# Patient Record
Sex: Male | Born: 1955 | Race: Black or African American | Hispanic: No | Marital: Single | Smoking: Current every day smoker
Health system: Southern US, Community
[De-identification: ages and names within clinical notes are randomized; demographics above are authoritative.]

## PROBLEM LIST (undated history)

## (undated) DIAGNOSIS — J189 Pneumonia, unspecified organism: Secondary | ICD-10-CM

## (undated) DIAGNOSIS — K635 Polyp of colon: Secondary | ICD-10-CM

## (undated) DIAGNOSIS — E785 Hyperlipidemia, unspecified: Secondary | ICD-10-CM

## (undated) DIAGNOSIS — Z21 Asymptomatic human immunodeficiency virus [HIV] infection status: Secondary | ICD-10-CM

## (undated) DIAGNOSIS — R7989 Other specified abnormal findings of blood chemistry: Secondary | ICD-10-CM

## (undated) DIAGNOSIS — I82409 Acute embolism and thrombosis of unspecified deep veins of unspecified lower extremity: Secondary | ICD-10-CM

## (undated) DIAGNOSIS — N200 Calculus of kidney: Secondary | ICD-10-CM

## (undated) DIAGNOSIS — B20 Human immunodeficiency virus [HIV] disease: Secondary | ICD-10-CM

## (undated) DIAGNOSIS — N529 Male erectile dysfunction, unspecified: Secondary | ICD-10-CM

## (undated) DIAGNOSIS — G510 Bell's palsy: Secondary | ICD-10-CM

## (undated) HISTORY — PX: LITHOTRIPSY: SUR834

## (undated) HISTORY — DX: Other specified abnormal findings of blood chemistry: R79.89

## (undated) HISTORY — DX: Polyp of colon: K63.5

## (undated) HISTORY — PX: COLONOSCOPY: SHX174

## (undated) HISTORY — DX: Calculus of kidney: N20.0

## (undated) HISTORY — DX: Hyperlipidemia, unspecified: E78.5

## (undated) HISTORY — DX: Human immunodeficiency virus (HIV) disease: B20

## (undated) HISTORY — DX: Male erectile dysfunction, unspecified: N52.9

## (undated) HISTORY — PX: INGUINAL HERNIA REPAIR: SUR1180

## (undated) HISTORY — DX: Asymptomatic human immunodeficiency virus (hiv) infection status: Z21

## (undated) HISTORY — DX: Pneumonia, unspecified organism: J18.9

## (undated) HISTORY — DX: Acute embolism and thrombosis of unspecified deep veins of unspecified lower extremity: I82.409

## (undated) HISTORY — DX: Bell's palsy: G51.0

---

## 1990-07-28 DIAGNOSIS — I82409 Acute embolism and thrombosis of unspecified deep veins of unspecified lower extremity: Secondary | ICD-10-CM

## 1990-07-28 HISTORY — DX: Acute embolism and thrombosis of unspecified deep veins of unspecified lower extremity: I82.409

## 2013-07-28 DIAGNOSIS — G510 Bell's palsy: Secondary | ICD-10-CM

## 2013-07-28 HISTORY — DX: Bell's palsy: G51.0

## 2016-10-28 DIAGNOSIS — B20 Human immunodeficiency virus [HIV] disease: Secondary | ICD-10-CM | POA: Diagnosis not present

## 2016-10-28 DIAGNOSIS — E559 Vitamin D deficiency, unspecified: Secondary | ICD-10-CM | POA: Diagnosis not present

## 2017-03-03 DIAGNOSIS — B20 Human immunodeficiency virus [HIV] disease: Secondary | ICD-10-CM | POA: Diagnosis not present

## 2017-03-03 DIAGNOSIS — E559 Vitamin D deficiency, unspecified: Secondary | ICD-10-CM | POA: Diagnosis not present

## 2017-04-09 ENCOUNTER — Other Ambulatory Visit: Payer: Federal, State, Local not specified - PPO

## 2017-04-09 ENCOUNTER — Other Ambulatory Visit (HOSPITAL_COMMUNITY)
Admission: RE | Admit: 2017-04-09 | Discharge: 2017-04-09 | Disposition: A | Discharge status: Home / Self Care | Payer: Federal, State, Local not specified - PPO | Source: Ambulatory Visit | Attending: Infectious Diseases | Admitting: Infectious Diseases

## 2017-04-09 DIAGNOSIS — Z111 Encounter for screening for respiratory tuberculosis: Secondary | ICD-10-CM

## 2017-04-09 DIAGNOSIS — Z79899 Other long term (current) drug therapy: Secondary | ICD-10-CM | POA: Diagnosis not present

## 2017-04-09 DIAGNOSIS — B20 Human immunodeficiency virus [HIV] disease: Secondary | ICD-10-CM

## 2017-04-09 DIAGNOSIS — Z113 Encounter for screening for infections with a predominantly sexual mode of transmission: Secondary | ICD-10-CM | POA: Insufficient documentation

## 2017-04-10 LAB — URINE CYTOLOGY ANCILLARY ONLY
Chlamydia: NEGATIVE
Neisseria Gonorrhea: NEGATIVE

## 2017-04-10 LAB — T-HELPER CELL (CD4) - (RCID CLINIC ONLY)
CD4 T CELL ABS: 1110 /uL (ref 400–2700)
CD4 T CELL HELPER: 24 % — AB (ref 33–55)

## 2017-04-11 LAB — HIV-1 RNA ULTRAQUANT REFLEX TO GENTYP+
HIV 1 RNA Quant: 64 Copies/mL — ABNORMAL HIGH
HIV-1 RNA QUANT, LOG: 1.8 {Log_copies}/mL — AB

## 2017-04-16 LAB — CBC WITH DIFFERENTIAL/PLATELET
BASOS PCT: 0.3 %
Basophils Absolute: 20 cells/uL (ref 0–200)
EOS ABS: 102 {cells}/uL (ref 15–500)
Eosinophils Relative: 1.5 %
HEMATOCRIT: 47.2 % (ref 38.5–50.0)
HEMOGLOBIN: 16.2 g/dL (ref 13.2–17.1)
Lymphs Abs: 4597 cells/uL — ABNORMAL HIGH (ref 850–3900)
MCH: 32.7 pg (ref 27.0–33.0)
MCHC: 34.3 g/dL (ref 32.0–36.0)
MCV: 95.2 fL (ref 80.0–100.0)
MPV: 10.2 fL (ref 7.5–12.5)
Monocytes Relative: 5.4 %
NEUTROS ABS: 1714 {cells}/uL (ref 1500–7800)
Neutrophils Relative %: 25.2 %
Platelets: 198 10*3/uL (ref 140–400)
RBC: 4.96 10*6/uL (ref 4.20–5.80)
RDW: 13.2 % (ref 11.0–15.0)
Total Lymphocyte: 67.6 %
WBC: 6.8 10*3/uL (ref 3.8–10.8)
WBCMIX: 367 {cells}/uL (ref 200–950)

## 2017-04-16 LAB — LIPID PANEL
CHOLESTEROL: 223 mg/dL — AB (ref <200)
HDL: 58 mg/dL (ref >40)
LDL Cholesterol (Calc): 139 mg/dL (calc) — ABNORMAL HIGH
Non-HDL Cholesterol (Calc): 165 mg/dL (calc) — ABNORMAL HIGH (ref <130)
Total CHOL/HDL Ratio: 3.8 (calc) (ref <5.0)
Triglycerides: 136 mg/dL (ref <150)

## 2017-04-16 LAB — COMPLETE METABOLIC PANEL WITH GFR
AG RATIO: 2 (calc) (ref 1.0–2.5)
ALBUMIN MSPROF: 4.5 g/dL (ref 3.6–5.1)
ALKALINE PHOSPHATASE (APISO): 82 U/L (ref 40–115)
ALT: 17 U/L (ref 9–46)
AST: 28 U/L (ref 10–35)
BILIRUBIN TOTAL: 0.5 mg/dL (ref 0.2–1.2)
BUN / CREAT RATIO: 10 (calc) (ref 6–22)
BUN: 14 mg/dL (ref 7–25)
CHLORIDE: 103 mmol/L (ref 98–110)
CO2: 28 mmol/L (ref 20–32)
Calcium: 9.5 mg/dL (ref 8.6–10.3)
Creat: 1.41 mg/dL — ABNORMAL HIGH (ref 0.70–1.33)
GFR, Est African American: 63 mL/min/{1.73_m2} (ref >=60)
GFR, Est Non African American: 55 mL/min/{1.73_m2} — ABNORMAL LOW (ref >=60)
GLOBULIN: 2.3 g/dL (ref 1.9–3.7)
Glucose, Bld: 93 mg/dL (ref 65–99)
Potassium: 4.5 mmol/L (ref 3.5–5.3)
SODIUM: 140 mmol/L (ref 135–146)
TOTAL PROTEIN: 6.8 g/dL (ref 6.1–8.1)

## 2017-04-16 LAB — HEPATITIS C ANTIBODY
HEP C AB: NONREACTIVE
SIGNAL TO CUT-OFF: 0.03 (ref <1.00)

## 2017-04-16 LAB — QUANTIFERON TB GOLD ASSAY (BLOOD)
QUANTIFERON NIL VALUE: 0.04 [IU]/mL
QUANTIFERON TB AG MINUS NIL: 0.03 [IU]/mL
QUANTIFERON(R)-TB GOLD: NEGATIVE

## 2017-04-16 LAB — HEPATITIS A ANTIBODY, TOTAL: Hepatitis A AB,Total: REACTIVE — AB

## 2017-04-16 LAB — HLA B*5701: HLA-B*5701 w/rflx HLA-B High: POSITIVE — AB

## 2017-04-16 LAB — HEPATITIS B CORE ANTIBODY, TOTAL: HEP B C TOTAL AB: NONREACTIVE

## 2017-04-16 LAB — HEPATITIS B SURFACE ANTIGEN: HEP B S AG: NONREACTIVE

## 2017-04-16 LAB — HEPATITIS B SURFACE ANTIBODY,QUALITATIVE: HEP B S AB: REACTIVE — AB

## 2017-04-16 LAB — RPR: RPR: NONREACTIVE

## 2017-04-23 ENCOUNTER — Ambulatory Visit (INDEPENDENT_AMBULATORY_CARE_PROVIDER_SITE_OTHER): Payer: Federal, State, Local not specified - PPO | Admitting: Infectious Diseases

## 2017-04-23 ENCOUNTER — Encounter: Payer: Self-pay | Admitting: Infectious Diseases

## 2017-04-23 VITALS — BP 121/85 | HR 78 | Temp 99.0°F | Wt 179.0 lb

## 2017-04-23 DIAGNOSIS — IMO0002 Reserved for concepts with insufficient information to code with codable children: Secondary | ICD-10-CM | POA: Insufficient documentation

## 2017-04-23 DIAGNOSIS — R7989 Other specified abnormal findings of blood chemistry: Secondary | ICD-10-CM

## 2017-04-23 DIAGNOSIS — Z720 Tobacco use: Secondary | ICD-10-CM | POA: Diagnosis not present

## 2017-04-23 DIAGNOSIS — B20 Human immunodeficiency virus [HIV] disease: Secondary | ICD-10-CM | POA: Diagnosis not present

## 2017-04-23 NOTE — Patient Instructions (Signed)
Nice to meet you!  Please continue taking your Prezcobix and Tivicay   Will have you come back in 1 month for labs and a visit with me in 6 weeks.

## 2017-04-23 NOTE — Progress Notes (Signed)
PCP - Patient, No Pcp Per    Patient Active Problem List   Diagnosis Date Noted  . HIV (human immunodeficiency virus infection) ([place]) 04/23/2017  . Creatinine elevation 04/23/2017  . Tobacco abuse 04/23/2017    Patient's Medications  New Prescriptions   No medications on file  Previous Medications   KRILL OIL 1000 MG CAPS    Take by mouth.   MULTIPLE VITAMINS-MINERALS (CENTRUM SILVER 50+MEN PO)    Take by mouth.   PREZCOBIX 800-150 MG TABLET       SILDENAFIL (VIAGRA) 100 MG TABLET    Take 100 mg by mouth daily as needed for erectile dysfunction.   TIVICAY 50 MG TABLET      Modified Medications   No medications on file  Discontinued Medications   No medications on file    SUBJECTIVE: Lorin Gawron presents to clinic today to establish care for his HIV infection.  HPI:  HIV = [name] moved to [facility] Jan 2018 from [place] area. Retired since 2017. No history of OIs. He is currently on a regimen of TIVICAY + PREZCOBIX - Dx Feb 1995 and has been on several medications for his HIV infection. He recalls being told he has mutations per his prior HIV provider. Can articulate that he is often undetectable with a healthy CD4 count. Last was seen in August of this year at his clinic in [place], [place] (Dr. Ula Lingo); he is now retired and plans on staying locally here in [place] area. Endorses no complaints today suggestive of associated opportunistic infection or advancing HIV disease such as fevers, night sweats, weight loss, anorexia, cough, SOB, nausea, vomiting, diarrhea, headache, sensory changes, lymphadenopathy or oral thrush. He has not missed any doses of his medications. Currently not sexually active.   Elevated Creatinine - has a history of kidney stones. Has had lithotripsy in the past. Passed a stone in the last few weeks potentially around the time of this lab draw. Has not had any back/flank pain, chills or fevers recently. Has seen urology or nephrology in the past.  When he feels the dull pain in his back usually increases his water intake significantly to flush himself out better.   Health Maintenance = Willow Reczek reports occasional exercise and much improved stress since retirement. He is currently smoking about 1/2 - 1 ppd and has for many years. Does not have any other chronic comorbid conditions that requires treatment.   Review of Systems: Review of Systems  Constitutional: Negative for chills, fever, malaise/fatigue and weight loss.  HENT: Negative for sore throat.        No dental problems  Respiratory: Negative for cough and sputum production.   Cardiovascular: Negative for chest pain and leg swelling.  Gastrointestinal: Negative for abdominal pain, diarrhea and vomiting.  Genitourinary: Negative for dysuria and flank pain.  Musculoskeletal: Negative for joint pain, myalgias and neck pain.  Skin: Negative for rash.  Neurological: Negative for dizziness, tingling and headaches.  Psychiatric/Behavioral: Negative for depression and substance abuse. The patient is not nervous/anxious and does not have insomnia.     No past medical history on file.  Social History  Substance Use Topics  . Smoking status: Current Every Day Smoker    Packs/day: 0.50    Types: Cigarettes  . Smokeless tobacco: Never Used     Comment: trying to quit  . Alcohol use 1.2 oz/week    1 Glasses of wine, 1 Shots of liquor per week  Comment: occ     No family history on file.  Not on File  Objective:  Vitals:   04/23/17 0844  BP: 121/85  Pulse: 78  Temp: 99 F (37.2 C)  TempSrc: Oral  Weight: 179 lb (81.2 kg)   There is no height or weight on file to calculate BMI.  Physical Exam  Constitutional: He is oriented to person, place, and time and well-developed, well-nourished, and in no distress.  HENT:  Mouth/Throat: No oral lesions. Normal dentition. No dental caries.  Eyes: No scleral icterus.  Cardiovascular: Normal rate, regular rhythm and  normal heart sounds.   Pulmonary/Chest: Effort normal and breath sounds normal.  Abdominal: Soft. He exhibits no distension. There is no tenderness.  Lymphadenopathy:    He has no cervical adenopathy.  Neurological: He is alert and oriented to person, place, and time.  Skin: Skin is warm and dry. No rash noted.  Psychiatric: Mood and affect normal.    Lab Results Lab Results  Component Value Date   WBC 6.8 04/09/2017   HGB 16.2 04/09/2017   HCT 47.2 04/09/2017   MCV 95.2 04/09/2017   PLT 198 04/09/2017    Lab Results  Component Value Date   CREATININE 1.41 (H) 04/09/2017   BUN 14 04/09/2017   NA 140 04/09/2017   K 4.5 04/09/2017   CL 103 04/09/2017   CO2 28 04/09/2017    Lab Results  Component Value Date   ALT 17 04/09/2017   AST 28 04/09/2017   BILITOT 0.5 04/09/2017    Lab Results  Component Value Date   CHOL 223 (H) 04/09/2017   HDL 58 04/09/2017   TRIG 136 04/09/2017   CHOLHDL 3.8 04/09/2017   HIV 1 RNA Quant (Copies/mL)  Date Value  04/09/2017 64 (H)   CD4 T Cell Abs (/uL)  Date Value  04/09/2017 1,110   No results found for: HAV Lab Results  Component Value Date   HEPBSAG NON-REACTIVE 04/09/2017   HEPBSAB REACTIVE (A) 04/09/2017   No results found for: HCVAB Lab Results  Component Value Date   CHLAMYDIAWP Negative 04/09/2017   N Negative 04/09/2017   No results found for: GCPROBEAPT No results found for: QUANTGOLD No results found for: RPR    Problem List Items Addressed This Visit      Other   Creatinine elevation    Not previously elevated to this extent per review of limited records. Unable to provide urine today. Will have him collect UA and BMET for next office visit to continue monitoring. May need nephrology referral.       Relevant Orders   COMPLETE METABOLIC PANEL WITH GFR   Urinalysis   Basic metabolic panel   HIV (human immunodeficiency virus infection) ([place]) - Primary (Chronic)    Will continue current regimen of TIVICAY  + PREZCOBIX. VL was 62 - will have him come back in 2 months so we can reassess trajectory. Will seek to obtain resistance profile. May need to add/change therapy. He does not need refills today and has Pharmacist, community. I advised him to call the office to report pharmacy once he finds one locally (currently having meds mailed to him).       Relevant Medications   PREZCOBIX 800-150 MG tablet   TIVICAY 50 MG tablet   Other Relevant Orders   HIV 1 RNA quant-no reflex-bld   Tobacco abuse    Counseled to quit today to reduce overall CV risk. We discussed that he is of prime  age for men to start having significant problems with ACS/CV disease as his cholesterol is a bit elevated over target.          Janene Madeira, MSN, NP-C [facility] for Infectious [place] [facility] Pager: [phone]  04/25/17 5:36 PM

## 2017-04-25 NOTE — Assessment & Plan Note (Signed)
Counseled to quit today to reduce overall CV risk. We discussed that he is of prime age for men to start having significant problems with ACS/CV disease as his cholesterol is a bit elevated over target.

## 2017-04-25 NOTE — Assessment & Plan Note (Signed)
Not previously elevated to this extent per review of limited records. Unable to provide urine today. Will have him collect UA and BMET for next office visit to continue monitoring. May need nephrology referral.

## 2017-04-25 NOTE — Assessment & Plan Note (Addendum)
Will continue current regimen of TIVICAY + PREZCOBIX. VL was 62 - will have him come back in 2 months so we can reassess trajectory. Will seek to obtain resistance profile. May need to add/change therapy. He does not need refills today and has Pharmacist, community. I advised him to call the office to report pharmacy once he finds one locally (currently having meds mailed to him).

## 2017-05-25 ENCOUNTER — Other Ambulatory Visit: Payer: Federal, State, Local not specified - PPO

## 2017-05-25 DIAGNOSIS — R7989 Other specified abnormal findings of blood chemistry: Secondary | ICD-10-CM

## 2017-05-25 DIAGNOSIS — B20 Human immunodeficiency virus [HIV] disease: Secondary | ICD-10-CM

## 2017-05-25 LAB — COMPLETE METABOLIC PANEL WITH GFR
AG Ratio: 1.7 (calc) (ref 1.0–2.5)
ALKALINE PHOSPHATASE (APISO): 81 U/L (ref 40–115)
ALT: 28 U/L (ref 9–46)
AST: 36 U/L — ABNORMAL HIGH (ref 10–35)
Albumin: 4.3 g/dL (ref 3.6–5.1)
BUN: 16 mg/dL (ref 7–25)
CO2: 28 mmol/L (ref 20–32)
CREATININE: 1.31 mg/dL (ref 0.70–1.33)
Calcium: 9.7 mg/dL (ref 8.6–10.3)
Chloride: 104 mmol/L (ref 98–110)
GFR, Est African American: 69 mL/min/{1.73_m2} (ref >=60)
GFR, Est Non African American: 60 mL/min/{1.73_m2} (ref >=60)
GLOBULIN: 2.6 g/dL (ref 1.9–3.7)
GLUCOSE: 94 mg/dL (ref 65–99)
Potassium: 4.4 mmol/L (ref 3.5–5.3)
SODIUM: 138 mmol/L (ref 135–146)
Total Bilirubin: 0.4 mg/dL (ref 0.2–1.2)
Total Protein: 6.9 g/dL (ref 6.1–8.1)

## 2017-05-27 LAB — HIV-1 RNA QUANT-NO REFLEX-BLD
HIV 1 RNA Quant: <20 copies/mL
HIV-1 RNA Quant, Log: <1.3 Log copies/mL

## 2017-05-29 ENCOUNTER — Encounter: Payer: Self-pay | Admitting: Licensed Clinical Social Worker

## 2017-06-08 ENCOUNTER — Encounter: Payer: Self-pay | Admitting: Infectious Diseases

## 2017-06-08 ENCOUNTER — Ambulatory Visit: Payer: Federal, State, Local not specified - PPO | Admitting: Infectious Diseases

## 2017-06-08 DIAGNOSIS — Z0000 Encounter for general adult medical examination without abnormal findings: Secondary | ICD-10-CM | POA: Diagnosis not present

## 2017-06-08 DIAGNOSIS — Z720 Tobacco use: Secondary | ICD-10-CM | POA: Diagnosis not present

## 2017-06-08 DIAGNOSIS — D126 Benign neoplasm of colon, unspecified: Secondary | ICD-10-CM | POA: Diagnosis not present

## 2017-06-08 DIAGNOSIS — N200 Calculus of kidney: Secondary | ICD-10-CM | POA: Diagnosis not present

## 2017-06-08 DIAGNOSIS — B20 Human immunodeficiency virus [HIV] disease: Secondary | ICD-10-CM | POA: Diagnosis not present

## 2017-06-08 DIAGNOSIS — Z8701 Personal history of pneumonia (recurrent): Secondary | ICD-10-CM | POA: Diagnosis not present

## 2017-06-08 DIAGNOSIS — Z1389 Encounter for screening for other disorder: Secondary | ICD-10-CM | POA: Diagnosis not present

## 2017-06-08 MED ORDER — PREZCOBIX 800-150 MG PO TABS: 1.0000 | ORAL_TABLET | Freq: Every day | ORAL | 5 refills | Status: DC

## 2017-06-08 MED ORDER — TIVICAY 50 MG PO TABS: 50.0000 mg | ORAL_TABLET | Freq: Every day | ORAL | 5 refills | Status: DC

## 2017-06-08 NOTE — Assessment & Plan Note (Addendum)
Continue Tivicay and Prezcobix. Was undetectable with healthy CD4 count 2 weeks ago. We made sure he had local pharmacy to pick up his medications. Offered to consider [facility] outpatient pharmacy as well. Will have him back in 4 months with reassessment on his HIV labs including kidney function to continue to monitor. Discussed that with the resistance I believe he has we don't have a much simpler medication regimen option for now to offer him. Will continue to try to get resistance records from previous ID provider.

## 2017-06-08 NOTE — Assessment & Plan Note (Signed)
Counseling provided today and discussed options to help him be successful quitting should he need help.

## 2017-06-08 NOTE — Assessment & Plan Note (Addendum)
Already received flu shot. Establishing with PCP.

## 2017-06-08 NOTE — Patient Instructions (Signed)
Continue with your Tivicay and Prezcobix every day with food.   Please come back in 4 months with labs 2 weeks before.

## 2017-06-08 NOTE — Progress Notes (Signed)
Brief ID: Sean Conrad transferred care in August 2018 from [place] area (Dr. Ramond Dial MD). Retired since 2017. Dx Feb 1995 and has been on several medications for his HIV infection including Truvada, AZT, Stribild and others he cannot recall.   HIV Mutations: "many" per his report. None were sent with transfer documentation.   OI History: none   PCP - Patient, No Pcp Per    Patient Active Problem List   Diagnosis Date Noted  . Healthcare maintenance 06/08/2017  . HIV (human immunodeficiency virus infection) ([place]) 04/23/2017  . Creatinine elevation 04/23/2017  . Tobacco abuse 04/23/2017      Medication List        Accurate as of 06/08/17  1:37 PM. Always use your most recent med list.          CENTRUM SILVER 50+MEN PO   Krill Oil 1000 MG Caps   PREZCOBIX 800-150 MG tablet Generic drug:  darunavir-cobicistat Take 1 tablet daily with breakfast by mouth.   sildenafil 100 MG tablet Commonly known as:  VIAGRA   TIVICAY 50 MG tablet Generic drug:  dolutegravir Take 1 tablet (50 mg total) daily by mouth.       Where to Get Your Medications    [id number] [place] [place]  [address] [place] [address]   Phone:  [phone]   PREZCOBIX 800-150 MG tablet  TIVICAY 50 MG tablet     SUBJECTIVE: Sean Conrad presents to clinic today to establish care for his HIV infection.  HPI:  HIV = Doing well on Tivicay and Prezcobix. Has a lot of anxiety today about making sure he has his medications on time and enough refills. Wants a local pharmacy. He has not missed a dose since our last visit. Endorses no complaints today suggestive of associated opportunistic infection or advancing HIV disease such as fevers, night sweats, weight loss, anorexia, cough, SOB, nausea, vomiting, diarrhea, headache, sensory changes, lymphadenopathy or oral thrush.   Elevated Creatinine -  has a history of kidney stones. Has had lithotripsy in the past. Passed a stone in the last few weeks potentially around the time of this lab draw. Has not had any back/flank pain, chills or fevers recently. Has seen urology or nephrology in the past. When he feels the dull pain in his back usually increases his water intake significantly to flush himself out better.   Health Maintenance = Sean Conrad reports occasional exercise and much improved stress since retirement. Has tried over the last 3-4 days to stop smoking and has not had a cigarette in that time. Feeling a little edgy lately. Has his first visit with PCP today to establish care.   Review of Systems: Review of Systems  Constitutional: Negative for chills, fever, malaise/fatigue and weight loss.  HENT: Negative for sore throat.        No dental problems  Respiratory: Negative for cough and sputum production.   Cardiovascular: Negative for chest pain and leg swelling.  Gastrointestinal: Negative for abdominal pain, diarrhea and vomiting.  Genitourinary: Negative for dysuria and flank pain.  Musculoskeletal: Negative for joint pain, myalgias and neck pain.  Skin: Negative for rash.  Neurological: Negative for dizziness, tingling and headaches.  Psychiatric/Behavioral: Negative for depression and substance abuse. The patient is not nervous/anxious and does not have insomnia.     No past medical history on file.  Social History  Tobacco Use  . Smoking status: Current Every Day Smoker    Packs/day: 0.50    Types: Cigarettes  . Smokeless tobacco: Never Used  . Tobacco comment: trying to quit  Substance Use Topics  . Alcohol use: Yes    Alcohol/week: 1.2 oz    Types: 1 Glasses of wine, 1 Shots of liquor per week    Comment: occ   . Drug use: No    No family history on file.  Allergies  Allergen Reactions  . Bactrim [Sulfamethoxazole-Trimethoprim] Rash    OBJECTIVE: Vitals:   06/08/17 1048  BP: 133/89  Pulse:  76  Temp: 97.7 F (36.5 C)  TempSrc: Oral  Weight: 177 lb (80.3 kg)   There is no height or weight on file to calculate BMI.  Physical Exam  Constitutional: He is oriented to person, place, and time and well-developed, well-nourished, and in no distress.  HENT:  Mouth/Throat: No oral lesions. Normal dentition. No dental caries.  Eyes: No scleral icterus.  Cardiovascular: Normal rate, regular rhythm and normal heart sounds.  Pulmonary/Chest: Effort normal and breath sounds normal.  Abdominal: Soft. He exhibits no distension. There is no tenderness.  Lymphadenopathy:    He has no cervical adenopathy.  Neurological: He is alert and oriented to person, place, and time.  Skin: Skin is warm and dry. No rash noted.  Psychiatric: Mood and affect normal.    Lab Results Lab Results  Component Value Date   WBC 6.8 04/09/2017   HGB 16.2 04/09/2017   HCT 47.2 04/09/2017   MCV 95.2 04/09/2017   PLT 198 04/09/2017    Lab Results  Component Value Date   CREATININE 1.31 05/25/2017   BUN 16 05/25/2017   NA 138 05/25/2017   K 4.4 05/25/2017   CL 104 05/25/2017   CO2 28 05/25/2017    Lab Results  Component Value Date   ALT 28 05/25/2017   AST 36 (H) 05/25/2017   BILITOT 0.4 05/25/2017    Lab Results  Component Value Date   CHOL 223 (H) 04/09/2017   HDL 58 04/09/2017   TRIG 136 04/09/2017   CHOLHDL 3.8 04/09/2017   HIV 1 RNA Quant  Date Value  05/25/2017 <20 NOT DETECTED copies/mL  04/09/2017 64 Copies/mL (H)   CD4 T Cell Abs (/uL)  Date Value  04/09/2017 1,110   Lab Results  Component Value Date   HEPBSAG NON-REACTIVE 04/09/2017   HEPBSAB REACTIVE (A) 04/09/2017   No results found for: HCVAB Lab Results  Component Value Date   CHLAMYDIAWP Negative 04/09/2017   N Negative 04/09/2017     Problem List Items Addressed This Visit      Other   Healthcare maintenance    Already received flu shot. Establishing with PCP.      HIV (human immunodeficiency virus  infection) ([place]) (Chronic)    Continue Tivicay and Prezcobix. Was undetectable with healthy CD4 count 2 weeks ago. We made sure he had local pharmacy to pick up his medications. Offered to consider [facility] outpatient pharmacy as well. Will have him back in 4 months with reassessment on his HIV labs including kidney function to continue to monitor. Discussed that with the resistance I believe he has we don't have a much simpler medication regimen option for now to offer him. Will continue to try to get resistance records from previous ID provider.       Relevant Medications   TIVICAY 50 MG tablet   PREZCOBIX 800-150 MG tablet  Tobacco abuse    Counseling provided today and discussed options to help him be successful quitting should he need help.          Janene Madeira, MSN, NP-C [facility] for Infectious [place] [facility] Pager: [phone]  06/08/17 1:37 PM

## 2017-06-15 ENCOUNTER — Telehealth: Payer: Self-pay | Admitting: *Deleted

## 2017-06-15 DIAGNOSIS — B20 Human immunodeficiency virus [HIV] disease: Secondary | ICD-10-CM

## 2017-06-15 MED ORDER — TIVICAY 50 MG PO TABS: 50.0000 mg | ORAL_TABLET | Freq: Every day | ORAL | 2 refills | Status: DC

## 2017-06-15 MED ORDER — PREZCOBIX 800-150 MG PO TABS: 1.0000 | ORAL_TABLET | Freq: Every day | ORAL | 2 refills | Status: DC

## 2017-06-15 NOTE — Telephone Encounter (Signed)
Patient called, asked if his prescriptions could be sent to the [facility], as [facility]  [facility] each for a 90 day supply. RN spoke with patient's insurance provider. No need for prior authorization, but 90 day supply from correct mail order pharmacy would be only $80/90 day supply each.  [facility] will [facility] to coordinate delivery and obtain a receipt - he will attempt reimbursement via copay assistance for Prezcobix and Tivicay. Landis Gandy, RN

## 2017-09-22 ENCOUNTER — Other Ambulatory Visit: Payer: Federal, State, Local not specified - PPO

## 2017-09-22 DIAGNOSIS — B20 Human immunodeficiency virus [HIV] disease: Secondary | ICD-10-CM | POA: Diagnosis not present

## 2017-09-22 LAB — COMPLETE METABOLIC PANEL WITH GFR
AG RATIO: 1.6 (calc) (ref 1.0–2.5)
ALBUMIN MSPROF: 4.4 g/dL (ref 3.6–5.1)
ALKALINE PHOSPHATASE (APISO): 95 U/L (ref 40–115)
ALT: 21 U/L (ref 9–46)
AST: 33 U/L (ref 10–35)
BUN / CREAT RATIO: 11 (calc) (ref 6–22)
BUN: 17 mg/dL (ref 7–25)
CO2: 28 mmol/L (ref 20–32)
CREATININE: 1.48 mg/dL — AB (ref 0.70–1.33)
Calcium: 10.3 mg/dL (ref 8.6–10.3)
Chloride: 104 mmol/L (ref 98–110)
GFR, EST NON AFRICAN AMERICAN: 51 mL/min/{1.73_m2} — AB (ref >=60)
GFR, Est African American: 59 mL/min/{1.73_m2} — ABNORMAL LOW (ref >=60)
GLOBULIN: 2.8 g/dL (ref 1.9–3.7)
Glucose, Bld: 97 mg/dL (ref 65–99)
POTASSIUM: 5 mmol/L (ref 3.5–5.3)
SODIUM: 138 mmol/L (ref 135–146)
Total Bilirubin: 0.4 mg/dL (ref 0.2–1.2)
Total Protein: 7.2 g/dL (ref 6.1–8.1)

## 2017-09-22 LAB — CBC WITH DIFFERENTIAL/PLATELET
BASOS PCT: 0.3 %
Basophils Absolute: 23 cells/uL (ref 0–200)
EOS ABS: 60 {cells}/uL (ref 15–500)
Eosinophils Relative: 0.8 %
HCT: 47.7 % (ref 38.5–50.0)
Hemoglobin: 15.9 g/dL (ref 13.2–17.1)
Lymphs Abs: 5078 cells/uL — ABNORMAL HIGH (ref 850–3900)
MCH: 31.6 pg (ref 27.0–33.0)
MCHC: 33.3 g/dL (ref 32.0–36.0)
MCV: 94.8 fL (ref 80.0–100.0)
MONOS PCT: 6.3 %
MPV: 9.9 fL (ref 7.5–12.5)
NEUTROS PCT: 24.9 %
Neutro Abs: 1868 cells/uL (ref 1500–7800)
PLATELETS: 193 10*3/uL (ref 140–400)
RBC: 5.03 10*6/uL (ref 4.20–5.80)
RDW: 13.1 % (ref 11.0–15.0)
TOTAL LYMPHOCYTE: 67.7 %
WBC: 7.5 10*3/uL (ref 3.8–10.8)
WBCMIX: 473 {cells}/uL (ref 200–950)

## 2017-09-23 LAB — T-HELPER CELL (CD4) - (RCID CLINIC ONLY)
CD4 T CELL HELPER: 24 % — AB (ref 33–55)
CD4 T Cell Abs: 1320 /uL (ref 400–2700)

## 2017-09-24 LAB — HIV-1 RNA QUANT-NO REFLEX-BLD
HIV 1 RNA QUANT: 33 {copies}/mL — AB
HIV-1 RNA QUANT, LOG: 1.52 {Log_copies}/mL — AB

## 2017-10-06 ENCOUNTER — Ambulatory Visit: Payer: Federal, State, Local not specified - PPO | Admitting: Infectious Diseases

## 2017-10-06 ENCOUNTER — Encounter: Payer: Self-pay | Admitting: Infectious Diseases

## 2017-10-06 VITALS — BP 125/88 | HR 79 | Temp 98.1°F | Wt 181.0 lb

## 2017-10-06 DIAGNOSIS — R7989 Other specified abnormal findings of blood chemistry: Secondary | ICD-10-CM

## 2017-10-06 DIAGNOSIS — B20 Human immunodeficiency virus [HIV] disease: Secondary | ICD-10-CM

## 2017-10-06 DIAGNOSIS — N529 Male erectile dysfunction, unspecified: Secondary | ICD-10-CM | POA: Diagnosis not present

## 2017-10-06 MED ORDER — SILDENAFIL CITRATE 100 MG PO TABS: 50.0000 mg | ORAL_TABLET | ORAL | 2 refills | Status: DC | PRN

## 2017-10-06 MED ORDER — PREZCOBIX 800-150 MG PO TABS: 1.0000 | ORAL_TABLET | Freq: Every day | ORAL | 2 refills | Status: DC

## 2017-10-06 MED ORDER — TIVICAY 50 MG PO TABS: 50.0000 mg | ORAL_TABLET | Freq: Every day | ORAL | 2 refills | Status: DC

## 2017-10-06 NOTE — Progress Notes (Signed)
Name: Sean Conrad  DOB: 04-Oct-1955 MRN: [id number] PCP: Renaldo Fiddler ([place])  Chief Complaint  Patient presents with  . Follow-up    Routine HIV care    Patient Active Problem List   Diagnosis Date Noted  . Erectile dysfunction 10/06/2017  . Healthcare maintenance 06/08/2017  . HIV (human immunodeficiency virus infection) ([place]) 04/23/2017  . Creatinine elevation 04/23/2017  . Tobacco abuse 04/23/2017    Subjective:  Brief ID:  Sean Conrad transferred care in August 2018 from [place] area (Dr. Ramond Dial MD). Retired since 2017. Dx Feb 1995 and   Previous Medications:   Truvada, AZT, Stribild and others he cannot recall.   Genotype:   "many" per his report. None were sent with transfer documentation.   HPI:  Sean Conrad is here today for routine follow up and is feeling very well. Has not been sick in the interval since our last office visit. He has continued his Tivicay and Prezcobix without any lapse in medications. He has been getting his medications from [place] and this is working very well for him. Reports no complaints today suggestive of associated opportunistic infection or advancing HIV disease such as fevers, night sweats, weight loss, anorexia, cough, SOB, nausea, vomiting, diarrhea, headache, sensory changes, lymphadenopathy or oral thrush.   Has been doing better about reducing and sometimes eliminating cigarette use. He goes several days without them but always seems to pick them back up again for a short intervals of time. He is hopeful that he can get his colonoscopy performed soon - he has a strong family history of colon cancer and was previously recommended to have Q77yr follow up.   Would like a refill on Viagra.   Review of Systems  Constitutional: Negative for chills, fever, malaise/fatigue and weight loss.  HENT: Negative for sore throat.        No dental problems  Respiratory: Negative for cough and sputum production.     Cardiovascular: Negative for chest pain and leg swelling.  Gastrointestinal: Negative for abdominal pain, diarrhea and vomiting.  Genitourinary: Negative for dysuria and flank pain.  Musculoskeletal: Negative for joint pain, myalgias and neck pain.  Skin: Negative for rash.  Neurological: Negative for dizziness, tingling and headaches.  Psychiatric/Behavioral: Negative for depression and substance abuse. The patient is not nervous/anxious and does not have insomnia.    Past Medical History:  Diagnosis Date  . Elevated serum creatinine   . Erectile dysfunction   . HIV infection ([place])     Social History   Tobacco Use  . Smoking status: Current Every Day Smoker    Packs/day: 0.50    Types: Cigarettes  . Smokeless tobacco: Never Used  . Tobacco comment: trying to quit  Substance Use Topics  . Alcohol use: Yes    Alcohol/week: 1.2 oz    Types: 1 Glasses of wine, 1 Shots of liquor per week    Comment: occ   . Drug use: No    Family History  Problem Relation Age of Onset  . Colon cancer Mother     Allergies  Allergen Reactions  . Bactrim [Sulfamethoxazole-Trimethoprim] Rash    OBJECTIVE: Vitals:   10/06/17 0925  BP: 125/88  Pulse: 79  Temp: 98.1 F (36.7 C)  TempSrc: Oral  Weight: 181 lb (82.1 kg)   There is no height or weight on file to calculate BMI.  Physical Exam  Constitutional: He is oriented to person, place, and time and well-developed, well-nourished, and in no distress.  HENT:  Mouth/Throat: No oral lesions. Normal dentition. No dental caries.  Eyes: No scleral icterus.  Cardiovascular: Normal rate, regular rhythm and normal heart sounds.  Pulmonary/Chest: Effort normal and breath sounds normal.  Abdominal: Soft. He exhibits no distension. There is no tenderness.  Lymphadenopathy:    He has no cervical adenopathy.  Neurological: He is alert and oriented to person, place, and time.  Skin: Skin is warm and dry. No rash noted.  Psychiatric: Mood and  affect normal.    Lab Results Lab Results  Component Value Date   WBC 7.5 09/22/2017   HGB 15.9 09/22/2017   HCT 47.7 09/22/2017   MCV 94.8 09/22/2017   PLT 193 09/22/2017    Lab Results  Component Value Date   CREATININE 1.48 (H) 09/22/2017   BUN 17 09/22/2017   NA 138 09/22/2017   K 5.0 09/22/2017   CL 104 09/22/2017   CO2 28 09/22/2017    Lab Results  Component Value Date   ALT 21 09/22/2017   AST 33 09/22/2017   BILITOT 0.4 09/22/2017    Lab Results  Component Value Date   CHOL 223 (H) 04/09/2017   HDL 58 04/09/2017   LDLCALC 139 (H) 04/09/2017   TRIG 136 04/09/2017   CHOLHDL 3.8 04/09/2017   Lab Results  Component Value Date   HEPBSAG NON-REACTIVE 04/09/2017   HEPBSAB REACTIVE (A) 04/09/2017   No results found for: HCVAB Lab Results  Component Value Date   CHLAMYDIAWP Negative 04/09/2017   N Negative 04/09/2017     Problem List Items Addressed This Visit      Genitourinary   Erectile dysfunction    Cautioned about interaction with Prezcobix and Viagra - I asked him to consider splitting pills in half and try just 50 mg to see if he gets the same effect from 100 mg. He takes this at the most once a week once. Refill sent in.       Relevant Medications   sildenafil (VIAGRA) 100 MG tablet     Other   HIV (human immunodeficiency virus infection) ([place]) - Primary (Chronic)    HIV 1 RNA Quant  Date Value  09/22/2017 33 copies/mL (H)  05/25/2017 <20 NOT DETECTED copies/mL  04/09/2017 64 Copies/mL (H)   CD4 T Cell Abs (/uL)  Date Value  09/22/2017 1,320  04/09/2017 1,110   Reviewed labs with him and very happy with his suppression on Tivicay + Prezcobix. I have not been able to secure records from his previous provider unfortunately regarding any resistance. Would assume M184V at the very least possibly K65R being he is off NRTIs.   Will continue these medications and see him back in 6 months with repeat labs at that time.       Relevant  Medications   TIVICAY 50 MG tablet   PREZCOBIX 800-150 MG tablet   Other Relevant Orders   HIV 1 RNA quant-no reflex-bld   T-helper cell (CD4)- ([facility] clinic only)      Janene Madeira, MSN, NP-C [facility] for Infectious [place] Pager: [phone]  10/06/17 3:36 PM

## 2017-10-06 NOTE — Patient Instructions (Addendum)
You look excellent today!   Continue your Prezcobix and Tivicay every day. Your labs look great.   Try cutting your viagra in half to see if you get the same effect.   Please come back in 6 months with labs 2 weeks prior to your visit.   Give your primary doctor a call to get a referral for colonoscopy - they should be able to do that for you.

## 2017-10-06 NOTE — Assessment & Plan Note (Signed)
HIV 1 RNA Quant  Date Value  09/22/2017 33 copies/mL (H)  05/25/2017 <20 NOT DETECTED copies/mL  04/09/2017 64 Copies/mL (H)   CD4 T Cell Abs (/uL)  Date Value  09/22/2017 1,320  04/09/2017 1,110   Reviewed labs with Sean Conrad and very happy with his suppression on Tivicay + Prezcobix. I have not been able to secure records from his previous provider unfortunately regarding any resistance. Would assume M184V at the very least possibly K65R being he is off NRTIs.   Will continue these medications and see Sean Conrad back in 6 months with repeat labs at that time.

## 2017-10-06 NOTE — Assessment & Plan Note (Signed)
Cautioned about interaction with Prezcobix and Viagra - I asked him to consider splitting pills in half and try just 50 mg to see if he gets the same effect from 100 mg. He takes this at the most once a week once. Refill sent in.

## 2018-02-24 DIAGNOSIS — K080 Exfoliation of teeth due to systemic causes: Secondary | ICD-10-CM | POA: Diagnosis not present

## 2018-03-01 DIAGNOSIS — K080 Exfoliation of teeth due to systemic causes: Secondary | ICD-10-CM | POA: Diagnosis not present

## 2018-03-15 DIAGNOSIS — K080 Exfoliation of teeth due to systemic causes: Secondary | ICD-10-CM | POA: Diagnosis not present

## 2018-04-01 ENCOUNTER — Other Ambulatory Visit: Payer: Federal, State, Local not specified - PPO

## 2018-04-01 DIAGNOSIS — B20 Human immunodeficiency virus [HIV] disease: Secondary | ICD-10-CM | POA: Diagnosis not present

## 2018-04-01 DIAGNOSIS — R7989 Other specified abnormal findings of blood chemistry: Secondary | ICD-10-CM

## 2018-04-01 DIAGNOSIS — Z21 Asymptomatic human immunodeficiency virus [HIV] infection status: Secondary | ICD-10-CM

## 2018-04-02 LAB — T-HELPER CELL (CD4) - (RCID CLINIC ONLY)
CD4 T CELL HELPER: 23 % — AB (ref 33–55)
CD4 T Cell Abs: 1110 /uL (ref 400–2700)

## 2018-04-05 LAB — BASIC METABOLIC PANEL
BUN / CREAT RATIO: 9 (calc) (ref 6–22)
BUN: 12 mg/dL (ref 7–25)
CALCIUM: 9.7 mg/dL (ref 8.6–10.3)
CO2: 25 mmol/L (ref 20–32)
Chloride: 104 mmol/L (ref 98–110)
Creat: 1.35 mg/dL — ABNORMAL HIGH (ref 0.70–1.33)
GLUCOSE: 92 mg/dL (ref 65–99)
Potassium: 4.2 mmol/L (ref 3.5–5.3)
Sodium: 138 mmol/L (ref 135–146)

## 2018-04-05 LAB — HIV-1 RNA QUANT-NO REFLEX-BLD
HIV 1 RNA QUANT: NOT DETECTED {copies}/mL
HIV-1 RNA Quant, Log: <1.3 Log copies/mL

## 2018-04-10 DIAGNOSIS — Z23 Encounter for immunization: Secondary | ICD-10-CM | POA: Diagnosis not present

## 2018-04-15 ENCOUNTER — Ambulatory Visit: Payer: Federal, State, Local not specified - PPO | Admitting: Infectious Diseases

## 2018-04-15 ENCOUNTER — Encounter: Payer: Self-pay | Admitting: Infectious Diseases

## 2018-04-15 VITALS — BP 118/84 | HR 73 | Temp 98.1°F | Wt 179.0 lb

## 2018-04-15 DIAGNOSIS — Z0000 Encounter for general adult medical examination without abnormal findings: Secondary | ICD-10-CM

## 2018-04-15 DIAGNOSIS — N529 Male erectile dysfunction, unspecified: Secondary | ICD-10-CM

## 2018-04-15 DIAGNOSIS — B20 Human immunodeficiency virus [HIV] disease: Secondary | ICD-10-CM

## 2018-04-15 DIAGNOSIS — Z21 Asymptomatic human immunodeficiency virus [HIV] infection status: Secondary | ICD-10-CM | POA: Diagnosis not present

## 2018-04-15 DIAGNOSIS — S39012A Strain of muscle, fascia and tendon of lower back, initial encounter: Secondary | ICD-10-CM

## 2018-04-15 MED ORDER — DICLOFENAC SODIUM 1 % TD GEL: 4.0000 g | Freq: Four times a day (QID) | TRANSDERMAL | 2 refills | Status: DC | PRN

## 2018-04-15 MED ORDER — PREZCOBIX 800-150 MG PO TABS: 1.0000 | ORAL_TABLET | Freq: Every day | ORAL | 2 refills | Status: DC

## 2018-04-15 MED ORDER — TIVICAY 50 MG PO TABS: 50.0000 mg | ORAL_TABLET | Freq: Every day | ORAL | 2 refills | Status: DC

## 2018-04-15 MED ORDER — SILDENAFIL CITRATE 100 MG PO TABS: 50.0000 mg | ORAL_TABLET | ORAL | 2 refills | Status: DC | PRN

## 2018-04-15 NOTE — Assessment & Plan Note (Signed)
Very well controlled on salvage regimen of Tivicay + Prezcobix. Refills to specialty pharmacy provided. Discussed U=U concept in addition to safe sex counseling and prevention of other STIs.  He will return in 49m with labs prior to visit.

## 2018-04-15 NOTE — Assessment & Plan Note (Signed)
Exam c/w muscle strain - discussed proper body mechanics, stretches, and will give topical nsaid cream. Advised to limit oral NSAIDs for kidney protection.

## 2018-04-15 NOTE — Progress Notes (Signed)
Name: Sean Conrad  DOB: 1956/01/26 MRN: [id number] PCP: Renaldo Fiddler ([place])  Patient Active Problem List   Diagnosis Date Noted  . Strain of lumbar region 04/15/2018  . Erectile dysfunction 10/06/2017  . Healthcare maintenance 06/08/2017  . HIV (human immunodeficiency virus infection) ([place]) 04/23/2017  . Creatinine elevation 04/23/2017  . Tobacco abuse 04/23/2017    Subjective:  Brief ID:  Sean Conrad transferred care in August 2018 from [place] area (Dr. Ramond Dial MD). Retired since 2017. Dx Feb 1995 and   Previous Medications:   Truvada, AZT, Stribild and others he cannot recall.   Genotype:   "many" per his report. None were sent with transfer documentation.   Chief Complaint  Patient presents with  . Follow-up    hiv    HPI/ROS:  Sean Conrad is here today for routine follow up and is feeling very well aside from gaining a few pounds in the setting of more liberal diet and no walking. He has continued his Tivicay and Prezcobix without any lapse in medications. He has been getting his medications from [facility] and this is working very well for him. Reports no complaints today suggestive of associated opportunistic infection or advancing HIV disease such as fevers, night sweats, weight loss, anorexia, cough, SOB, nausea, vomiting, diarrhea, headache, sensory changes, lymphadenopathy or oral thrush. He would like to discuss colonoscopy referral - both of his parents died r/t colon cancer and has had other siblings with it. Was on Q40yr follow up and his last study was 2016. No established GI provider here in [place].   Continues to smoke cigarettes as he is "more bored" since retirement. 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 His only complaint today is some lower back pain that he attributes to sleeping on a couch. He is now sleeping in the bed which has helped. Has used BioFreeze  over the counter that does help a little. Does not take anything by mouth for pain as he is concerned about kidneys.   Would like a refill on Viagra.   Review of Systems  Constitutional: Negative for chills, fever, malaise/fatigue and weight loss.  HENT: Negative for sore throat.        No dental problems  Respiratory: Negative for cough and sputum production.   Cardiovascular: Negative for chest pain and leg swelling.  Gastrointestinal: Negative for abdominal pain, diarrhea and vomiting.  Genitourinary: Negative for dysuria and flank pain.  Musculoskeletal: Negative for joint pain, myalgias and neck pain.  Skin: Negative for rash.  Neurological: Negative for dizziness, tingling and headaches.  Psychiatric/Behavioral: Negative for depression and substance abuse. The patient is not nervous/anxious and does not have insomnia.    Past Medical History:  Diagnosis Date  . Elevated serum creatinine   . Erectile dysfunction   . HIV infection ([place])     Social History   Tobacco Use  . Smoking status: Current Every Day Smoker    Packs/day: 0.50    Types: Cigarettes  . Smokeless tobacco: Never Used  . Tobacco comment: trying to quit  Substance Use Topics  . Alcohol use: Yes    Alcohol/week: 2.0 standard drinks    Types: 1 Glasses of wine, 1 Shots of liquor per week    Comment: occ   . Drug use: No    Family History  Problem Relation Age of Onset  . Colon cancer Mother  Allergies  Allergen Reactions  . Bactrim [Sulfamethoxazole-Trimethoprim] Rash    OBJECTIVE: Vitals:   04/15/18 0847  BP: 118/84  Pulse: 73  Temp: 98.1 F (36.7 C)  TempSrc: Oral  Weight: 179 lb (81.2 kg)   There is no height or weight on file to calculate BMI.  Physical Exam  Constitutional: He is oriented to person, place, and time and well-developed, well-nourished, and in no distress.  HENT:  Mouth/Throat: No oral lesions. Normal dentition. No dental caries.  Eyes: No scleral icterus.    Cardiovascular: Normal rate, regular rhythm and normal heart sounds.  Pulmonary/Chest: Effort normal and breath sounds normal.  Abdominal: Soft. He exhibits no distension. There is no tenderness.  Lymphadenopathy:    He has no cervical adenopathy.  Neurological: He is alert and oriented to person, place, and time.  Skin: Skin is warm and dry. No rash noted.  Psychiatric: Mood and affect normal.    Lab Results Lab Results  Component Value Date   WBC 7.5 09/22/2017   HGB 15.9 09/22/2017   HCT 47.7 09/22/2017   MCV 94.8 09/22/2017   PLT 193 09/22/2017    Lab Results  Component Value Date   CREATININE 1.35 (H) 04/01/2018   BUN 12 04/01/2018   NA 138 04/01/2018   K 4.2 04/01/2018   CL 104 04/01/2018   CO2 25 04/01/2018    Lab Results  Component Value Date   ALT 21 09/22/2017   AST 33 09/22/2017   BILITOT 0.4 09/22/2017    Lab Results  Component Value Date   CHOL 223 (H) 04/09/2017   HDL 58 04/09/2017   LDLCALC 139 (H) 04/09/2017   TRIG 136 04/09/2017   CHOLHDL 3.8 04/09/2017   Lab Results  Component Value Date   HEPBSAG NON-REACTIVE 04/09/2017   HEPBSAB REACTIVE (A) 04/09/2017   No results found for: HCVAB Lab Results  Component Value Date   CHLAMYDIAWP Negative 04/09/2017   N Negative 04/09/2017     Problem List Items Addressed This Visit      Musculoskeletal and Integument   Strain of lumbar region    Exam c/w muscle strain - discussed proper body mechanics, stretches, and will give topical nsaid cream. Advised to limit oral NSAIDs for kidney protection.         Genitourinary   Erectile dysfunction    He is virally suppressed - refill for Viagra given to patient. He has good effect with 1/2 a pill d/t cobicistat and I asked him to please only use the lower dose (50 mg).       Relevant Medications   sildenafil (VIAGRA) 100 MG tablet     Other   Healthcare maintenance    Strong family history of colon cancer and due for recommended colonoscopy -  will send referral to GI here at [facility]. He has had his flu vaccine this flu season. Otherwise up to date for [facility].        Relevant Orders   Ambulatory referral to Gastroenterology   HIV (human immunodeficiency virus infection) ([place]) - Primary (Chronic)    Very well controlled on salvage regimen of Tivicay + Prezcobix. Refills to specialty pharmacy provided. Discussed U=U concept in addition to safe sex counseling and prevention of other STIs.  He will return in 67m with labs prior to visit.       Relevant Medications   TIVICAY 50 MG tablet   PREZCOBIX 800-150 MG tablet   Other Relevant Orders   T-helper cell (CD4)- ([facility]  clinic only)   CBC   Comprehensive metabolic panel   RPR   Lipid panel     Janene Madeira, MSN, NP-C [place] [facility] [place] Pager: [phone]  04/15/18 2:19 PM

## 2018-04-15 NOTE — Assessment & Plan Note (Signed)
He is virally suppressed - refill for Viagra given to patient. He has good effect with 1/2 a pill d/t cobicistat and I asked him to please only use the lower dose (50 mg).

## 2018-04-15 NOTE — Patient Instructions (Signed)
Continue your Tivicay + Prezcobix  Try only 1/2 a tablet for your sildenafil.   Pillow between the knees at night to sleep. (not on your lovely couch).     Back Pain, Adult Many adults have back pain from time to time. Common causes of back pain include:  A strained muscle or ligament.  Wear and tear (degeneration) of the spinal disks.  Arthritis.  A hit to the back.  Back pain can be short-lived (acute) or last a long time (chronic). A physical exam, lab tests, and imaging studies may be done to find the cause of your pain. Follow these instructions at home: Managing pain and stiffness  Take over-the-counter and prescription medicines only as told by your health care provider.  If directed, apply heat to the affected area as often as told by your health care provider. Use the heat source that your health care provider recommends, such as a moist heat pack or a heating pad. ? Place a towel between your skin and the heat source. ? Leave the heat on for 20-30 minutes. ? Remove the heat if your skin turns bright red. This is especially important if you are unable to feel pain, heat, or cold. You have a greater risk of getting burned.  If directed, apply ice to the injured area: ? Put ice in a plastic bag. ? Place a towel between your skin and the bag. ? Leave the ice on for 20 minutes, 2-3 times a day for the first 2-3 days. Activity  Do not stay in bed. Resting more than 1-2 days can delay your recovery.  Take short walks on even surfaces as soon as you are able. Try to increase the length of time you walk each day.  Do not sit, drive, or stand in one place for more than 30 minutes at a time. Sitting or standing for long periods of time can put stress on your back.  Use proper lifting techniques. When you bend and lift, use positions that put less stress on your back: ? [place] your knees. ? Keep the load close to your body. ? Avoid twisting.  Exercise regularly as told by  your health care provider. Exercising will help your back heal faster. This also helps prevent back injuries by keeping muscles strong and flexible.  Your health care provider may recommend that you see a physical therapist. This person can help you come up with a safe exercise program. Do any exercises as told by your physical therapist. Lifestyle  Maintain a healthy weight. Extra weight puts stress on your back and makes it difficult to have good posture.  Avoid activities or situations that make you feel anxious or stressed. Learn ways to manage anxiety and stress. One way to manage stress is through exercise. Stress and anxiety increase muscle tension and can make back pain worse. General instructions  Sleep on a firm mattress in a comfortable position. Try lying on your side with your knees slightly bent. If you lie on your back, put a pillow under your knees.  Follow your treatment plan as told by your health care provider. This may include: ? Cognitive or behavioral therapy. ? Acupuncture or massage therapy. ? Meditation or yoga. Contact a health care provider if:  You have pain that is not relieved with rest or medicine.  You have increasing pain going down into your legs or buttocks.  Your pain does not improve in 2 weeks.  You have pain at night.  You  lose weight.  You have a fever or chills. Get help right away if:  You develop new bowel or bladder control problems.  You have unusual weakness or numbness in your arms or legs.  You develop nausea or vomiting.  You develop abdominal pain.  You feel faint. Summary  Many adults have back pain from time to time. A physical exam, lab tests, and imaging studies may be done to find the cause of your pain.  Use proper lifting techniques. When you bend and lift, use positions that put less stress on your back.  Take over-the-counter and prescription medicines and apply heat or ice as directed by your health care  provider. This information is not intended to replace advice given to you by your health care provider. Make sure you discuss any questions you have with your health care provider. Document Released: 07/14/2005 Document Revised: 08/18/2016 Document Reviewed: 08/18/2016 Elsevier Interactive Patient Education  [employer].

## 2018-04-15 NOTE — Assessment & Plan Note (Addendum)
Strong family history of colon cancer and due for recommended colonoscopy - will send referral to GI here at [facility]. He has had his flu vaccine this flu season. Otherwise up to date for [facility].

## 2018-04-29 ENCOUNTER — Encounter: Payer: Self-pay | Admitting: Gastroenterology

## 2018-06-04 ENCOUNTER — Ambulatory Visit: Payer: Federal, State, Local not specified - PPO | Admitting: Gastroenterology

## 2018-06-04 ENCOUNTER — Encounter (INDEPENDENT_AMBULATORY_CARE_PROVIDER_SITE_OTHER): Payer: Self-pay

## 2018-06-04 ENCOUNTER — Encounter: Payer: Self-pay | Admitting: Gastroenterology

## 2018-06-04 VITALS — BP 110/70 | HR 72 | Ht 66.0 in | Wt 172.0 lb

## 2018-06-04 DIAGNOSIS — Z86010 Personal history of colonic polyps: Secondary | ICD-10-CM

## 2018-06-04 DIAGNOSIS — Z800 Family history of malignant neoplasm of digestive organs: Secondary | ICD-10-CM

## 2018-06-04 NOTE — Progress Notes (Signed)
[facility] VISIT   Primary Care Provider Tisovec, Fransico Him, MD [address] [place] [address] [phone]  Referring Provider Westphalia Callas, NP [address] [place], [place] [address] [phone]  Patient Profile: Sean Conrad is a 62 y.o. male with a pmh significant for FHx of Colon Cancer, prior VTE, CRI, HIV, HLD, Nephrolithiasis.  All  The patient presents to the [facility] for an evaluation and management of problem(s) noted below:  Problem List 1. Family history of colon cancer   2. History of colonic polyps     History of Present Illness: This is the patient's first visit to the [facility].  He reports as a high risk individual for colon cancer screening in the setting of 2 first-degree family members including his mother who passed away from colon cancer at the age of 27 and his father who passed away from colon cancer at the age of 86.  He describes having at least 3-4 colonoscopies.  He has had at least one polyp removed during 1 of these sessions but has not been found to have multiple polyps.  He describes having no significant GI issues and no history of rectal bleeding to his report or significant abdominal pains.  His last colonoscopy was 3 years ago and he was told to follow-up in 3 years but it is not clear why.  He does describe being told at different times in his life to have a colonoscopy within a year within 3 years or 5 years.  He reports a prior history of hemorrhoids.  He has never had an upper endoscopy.    GI Review of Systems Positive as above Negative for pyrosis, dysphagia, odynophagia, nausea, vomiting, abdominal pain, abdominal bloating, change in bowel habits, melena, hematochezia  Review of Systems General: Denies fevers/chills/weight loss HEENT: Denies oral lesions Cardiovascular: Denies chest pain Pulmonary: Denies shortness of breath Gastroenterological: See HPI Genitourinary:  Denies darkened urine Hematological: Denies easy bruising/bleeding Endocrine: Denies temperature intolerance Dermatological: Denies jaundice Psychological: Mood is stable   Medications Current Outpatient Medications  Medication Sig Dispense Refill  . diclofenac sodium (VOLTAREN) 1 % GEL Apply 4 g topically 4 (four) times daily as needed. 100 g 2  . Krill Oil 1000 MG CAPS Take by mouth.    . Multiple Vitamins-Minerals (CENTRUM SILVER 50+MEN PO) Take by mouth.    Marland Kitchen PREZCOBIX 800-150 MG tablet Take 1 tablet by mouth daily with breakfast. 90 tablet 2  . sildenafil (VIAGRA) 100 MG tablet Take 0.5-1 tablets (50-100 mg total) by mouth as needed for erectile dysfunction. 10 tablet 2  . TIVICAY 50 MG tablet Take 1 tablet (50 mg total) by mouth daily. 90 tablet 2   No current facility-administered medications for this visit.     Allergies Allergies  Allergen Reactions  . Bactrim [Sulfamethoxazole-Trimethoprim] Rash    Histories Past Medical History:  Diagnosis Date  . Bell's palsy 2015  . Colon polyps   . DVT (deep venous thrombosis) ([place]) 1992   Right calf  . Elevated serum creatinine   . Erectile dysfunction   . HIV infection ([place])   . Hyperlipidemia   . Kidney stones   . Pneumonia    Past Surgical History:  Procedure Laterality Date  . COLONOSCOPY    . INGUINAL HERNIA REPAIR Left   . LITHOTRIPSY     Social History   Socioeconomic History  . Marital status: Single    Spouse name: Not on file  .  Number of children: 0  . Years of education: Not on file  . Highest education level: Not on file  Occupational History  . Occupation: [employer] part time  Social Needs  . Financial resource strain: Not on file  . Food insecurity:    Worry: Not on file    Inability: Not on file  . Transportation needs:    Medical: Not on file    Non-medical: Not on file  Tobacco Use  . Smoking status: Current Every Day Smoker    Packs/day: 0.50    Types: Cigarettes  . Smokeless tobacco:  Never Used  . Tobacco comment: trying to quit  Substance and Sexual Activity  . Alcohol use: Yes    Alcohol/week: 2.0 standard drinks    Types: 1 Glasses of wine, 1 Shots of liquor per week    Comment: social  . Drug use: No  . Sexual activity: Yes    Birth control/protection: Condom  Lifestyle  . Physical activity:    Days per week: Not on file    Minutes per session: Not on file  . Stress: Not on file  Relationships  . Social connections:    Talks on phone: Not on file    Gets together: Not on file    Attends religious service: Not on file    Active member of club or organization: Not on file    Attends meetings of clubs or organizations: Not on file    Relationship status: Not on file  . Intimate partner violence:    Fear of current or ex partner: Not on file    Emotionally abused: Not on file    Physically abused: Not on file    Forced sexual activity: Not on file  Other Topics Concern  . Not on file  Social History Narrative  . Not on file   Family History  Problem Relation Age of Onset  . Colon cancer Mother        dx in her early 73's  . Colon cancer Father        dx in his 27's  . Esophageal cancer Neg Hx   . Inflammatory bowel disease Neg Hx   . Liver disease Neg Hx   . Pancreatic cancer Neg Hx   . Stomach cancer Neg Hx    I have reviewed his medical, social, and family history in detail and updated the electronic medical record as necessary.    PHYSICAL EXAMINATION  BP 110/70   Pulse 72   Ht 5\' 6"  (1.676 m)   Wt 172 lb (78 kg)   BMI 27.76 kg/m  Wt Readings from Last 3 Encounters:  06/04/18 172 lb (78 kg)  04/15/18 179 lb (81.2 kg)  10/06/17 181 lb (82.1 kg)  GEN: NAD, appears stated age, doesn't appear chronically ill PSYCH: Cooperative, without pressured speech EYE: Conjunctivae pink, sclerae anicteric ENT: MMM, without oral ulcers, no erythema or exudates noted NECK: Supple CV: RR without R/Gs  RESP: CTAB posteriorly, without wheezing GI:  NABS, soft, NT/ND, without rebound or guarding, no HSM appreciated MSK/EXT: No lower extremity edema SKIN: No jaundice NEURO:  Alert & Oriented x 3, no focal deficits   REVIEW OF DATA  I reviewed the following data at the time of this encounter:  GI Procedures and Studies  Reports history of at least 3-4 colonoscopies but no records  Laboratory Studies  Reviewed in epic  Imaging Studies  No relevant studies to review   ASSESSMENT  Mr. Knoke  is a 62 y.o. male with a pmh significant for The patient is seen today for evaluation and management of:  1. Family history of colon cancer   2. History of colonic polyps    This is a clinically and hemodynamically stable patient who presents for consideration of follow-up colonoscopy in the setting of high risk surveillance in the setting of family history of colon cancer in 2 first-degree relatives.  Reports his last colonoscopy being approximately 3 years ago in [place].  He does not have the records but also reports no colon polyp being found at that point in time.  Is not clear to me unless his preparations have been poor as to why he is been having so frequent colonoscopies with just the finding of a single colon polyp unless this was an advanced lesion.  We will try and get those records ourselves or the patient will try and obtain them in [place].  If we are unable to obtain these records because the patient's prior GI doctor is retiring then I am happy to proceed with a colonoscopy and then get him back on a regular surveillance based on the number of colon polyps that he is found as well as based on increased risk surveillance.  He would normally be an every 5-year patient unless the patient has had prior colon polyps or advanced lesions or poor preparations.  The risks and benefits of endoscopic evaluation were discussed with the patient; these include but are not limited to the risk of perforation, infection, bleeding, missed lesions, lack  of diagnosis, severe illness requiring hospitalization, as well as anesthesia and sedation related illnesses.  The patient is agreeable to proceed as necessary.  We will plan to see in the course the next few weeks whether we obtain these if we do not the patient will have a follow-up with [place] scheduled and the patient knows to let us know by the new year whether he is received these records are not.  Either way a colonoscopy is reasonable and should be performed at an appropriate interval.   PLAN  1. Family history of colon cancer - Will try to obtain records (he is considered high-risk screening/surveillance based on his history) - If unable to obtain records will proceed with colonoscopy and then get patient back on appropriate screening interval (patient and our group will work on this)  2. History of colonic polyps   No orders of the defined types were placed in this encounter.   New Prescriptions   No medications on file   Modified Medications   No medications on file    Planned Follow Up: No follow-ups on file.   Justice Britain, MD [facility] Advanced [facility]oscopy Office # [phone]

## 2018-06-04 NOTE — Patient Instructions (Signed)
Normal BMI (Body Mass Index- based on height and weight) is between 19 and 25. Your BMI today is Body mass index is 27.76 kg/m. Sean Conrad Please consider follow up  regarding your BMI with your Primary Care Provider.  We will contact you in the next few weeks to schedule a follow up appointment with Dr Lilia Argue for [facility] January appointment  Thank you for entrusting me with your care and choosing [facility] care.  Dr Rush Landmark

## 2018-06-06 ENCOUNTER — Encounter: Payer: Self-pay | Admitting: Gastroenterology

## 2018-06-08 DIAGNOSIS — Z125 Encounter for screening for malignant neoplasm of prostate: Secondary | ICD-10-CM | POA: Diagnosis not present

## 2018-06-08 DIAGNOSIS — Z0000 Encounter for general adult medical examination without abnormal findings: Secondary | ICD-10-CM | POA: Diagnosis not present

## 2018-06-08 DIAGNOSIS — Z800 Family history of malignant neoplasm of digestive organs: Secondary | ICD-10-CM | POA: Insufficient documentation

## 2018-06-08 DIAGNOSIS — Z86010 Personal history of colonic polyps: Secondary | ICD-10-CM | POA: Insufficient documentation

## 2018-06-08 DIAGNOSIS — R82998 Other abnormal findings in urine: Secondary | ICD-10-CM | POA: Diagnosis not present

## 2018-06-15 DIAGNOSIS — Z125 Encounter for screening for malignant neoplasm of prostate: Secondary | ICD-10-CM | POA: Diagnosis not present

## 2018-06-15 DIAGNOSIS — Z0000 Encounter for general adult medical examination without abnormal findings: Secondary | ICD-10-CM | POA: Diagnosis not present

## 2018-06-15 DIAGNOSIS — B20 Human immunodeficiency virus [HIV] disease: Secondary | ICD-10-CM | POA: Diagnosis not present

## 2018-06-15 DIAGNOSIS — D126 Benign neoplasm of colon, unspecified: Secondary | ICD-10-CM | POA: Diagnosis not present

## 2018-06-15 DIAGNOSIS — Z800 Family history of malignant neoplasm of digestive organs: Secondary | ICD-10-CM | POA: Diagnosis not present

## 2018-06-15 DIAGNOSIS — Z1212 Encounter for screening for malignant neoplasm of rectum: Secondary | ICD-10-CM | POA: Diagnosis not present

## 2018-06-15 DIAGNOSIS — N200 Calculus of kidney: Secondary | ICD-10-CM | POA: Diagnosis not present

## 2018-06-15 DIAGNOSIS — Z1389 Encounter for screening for other disorder: Secondary | ICD-10-CM | POA: Diagnosis not present

## 2018-06-16 ENCOUNTER — Other Ambulatory Visit: Payer: Self-pay | Admitting: Internal Medicine

## 2018-06-16 DIAGNOSIS — N5089 Other specified disorders of the male genital organs: Secondary | ICD-10-CM

## 2018-06-21 ENCOUNTER — Ambulatory Visit
Admission: RE | Admit: 2018-06-21 | Discharge: 2018-06-21 | Disposition: A | Discharge status: Home / Self Care | Payer: Federal, State, Local not specified - PPO | Source: Ambulatory Visit | Attending: Internal Medicine | Admitting: Internal Medicine

## 2018-06-21 DIAGNOSIS — N433 Hydrocele, unspecified: Secondary | ICD-10-CM | POA: Diagnosis not present

## 2018-06-21 DIAGNOSIS — N5089 Other specified disorders of the male genital organs: Secondary | ICD-10-CM

## 2018-06-29 DIAGNOSIS — R7989 Other specified abnormal findings of blood chemistry: Secondary | ICD-10-CM | POA: Diagnosis not present

## 2018-08-03 ENCOUNTER — Other Ambulatory Visit (INDEPENDENT_AMBULATORY_CARE_PROVIDER_SITE_OTHER): Payer: Federal, State, Local not specified - PPO

## 2018-08-03 ENCOUNTER — Encounter: Payer: Self-pay | Admitting: Gastroenterology

## 2018-08-03 ENCOUNTER — Ambulatory Visit: Payer: Federal, State, Local not specified - PPO | Admitting: Gastroenterology

## 2018-08-03 VITALS — BP 118/76 | HR 72 | Ht 66.0 in | Wt 166.6 lb

## 2018-08-03 DIAGNOSIS — R945 Abnormal results of liver function studies: Secondary | ICD-10-CM | POA: Diagnosis not present

## 2018-08-03 DIAGNOSIS — R7989 Other specified abnormal findings of blood chemistry: Secondary | ICD-10-CM

## 2018-08-03 DIAGNOSIS — Z9189 Other specified personal risk factors, not elsewhere classified: Secondary | ICD-10-CM | POA: Diagnosis not present

## 2018-08-03 DIAGNOSIS — Z800 Family history of malignant neoplasm of digestive organs: Secondary | ICD-10-CM | POA: Diagnosis not present

## 2018-08-03 LAB — COMPREHENSIVE METABOLIC PANEL
ALT: 16 U/L (ref 0–53)
AST: 26 U/L (ref 0–37)
Albumin: 3.9 g/dL (ref 3.5–5.2)
Alkaline Phosphatase: 84 U/L (ref 39–117)
BUN: 14 mg/dL (ref 6–23)
CO2: 27 mEq/L (ref 19–32)
Calcium: 9.7 mg/dL (ref 8.4–10.5)
Chloride: 105 mEq/L (ref 96–112)
Creatinine, Ser: 1.04 mg/dL (ref 0.40–1.50)
GFR: 93.67 mL/min (ref >60.00)
Glucose, Bld: 84 mg/dL (ref 70–99)
POTASSIUM: 4.4 meq/L (ref 3.5–5.1)
Sodium: 137 mEq/L (ref 135–145)
TOTAL PROTEIN: 7.1 g/dL (ref 6.0–8.3)
Total Bilirubin: 0.4 mg/dL (ref 0.2–1.2)

## 2018-08-03 NOTE — Patient Instructions (Signed)
You have been scheduled for a colonoscopy. Please follow written instructions given to you at your visit today.  Please pick up your prep supplies at the pharmacy within the next 1-3 days. If you use inhalers (even only as needed), please bring them with you on the day of your procedure. Your physician has requested that you go to www.startemmi.com and enter the access code given to you at your visit today. This web site gives a general overview about your procedure. However, you should still follow specific instructions given to you by our office regarding your preparation for the procedure.  Your provider has requested that you go to the basement level for lab work before leaving today. Press "B" on the elevator. The lab is located at the first door on the left as you exit the elevator.  Thank you for entrusting me with your care and choosing [facility] care.  Dr Rush Landmark

## 2018-08-03 NOTE — Progress Notes (Signed)
[place] VISIT   Primary Care Provider Tisovec, Fransico Him, MD [place] [address] [phone]  Patient Profile: Sean Conrad is a 63 y.o. male with a pmh significant for FHx of Colon Cancer, prior VTE, CRI, HIV, HLD, Nephrolithiasis.  All  The patient presents to the [facility] for an evaluation and management of problem(s) noted below:  Problem List 1. Abnormal liver function test   2. Family history of colon cancer   3. High risk for colon cancer     History of Present Illness: Please see initial consultation note for full details of HPI.  Interval History The patient returns for scheduled follow-up.  Unfortunately due to the closure/retirement of his prior gastroenterologist we were unable to obtain any records.  He also was in the area was unable to obtain any records.  After reviewing the patient's referral there had been concern for abnormal liver biochemical testing.  When we looked back at his records the epic/care everywhere we do not see any evidence of elevations in his liver tests. He remembers being told at one point he had to have liver tests checked but cannot recall being told that they were abnormal.  He denies any other new GI symptoms.  He denies melena or hematochezia.   GI Review of Systems Positive as above Negative for dysphagia, odynophagia, abdominal pain, bloating, change in bowel habits  Review of Systems General: Denies fevers/chills/weight loss Cardiovascular: Denies chest pain Pulmonary: Denies shortness of breath Gastroenterological: See HPI Genitourinary: Denies darkened urine Hematological: Denies easy bruising Dermatological: Denies jaundice Psychological: Mood is stable   Medications Current Outpatient Medications  Medication Sig Dispense Refill  . diclofenac sodium (VOLTAREN) 1 % GEL Apply 4 g topically 4 (four) times daily as needed. 100 g 2  . Krill Oil 1000 MG CAPS  Take by mouth.    . Multiple Vitamins-Minerals (CENTRUM SILVER 50+MEN PO) Take by mouth.    Marland Kitchen PREZCOBIX 800-150 MG tablet Take 1 tablet by mouth daily with breakfast. 90 tablet 2  . sildenafil (VIAGRA) 100 MG tablet Take 0.5-1 tablets (50-100 mg total) by mouth as needed for erectile dysfunction. 10 tablet 2  . TIVICAY 50 MG tablet Take 1 tablet (50 mg total) by mouth daily. 90 tablet 2   No current facility-administered medications for this visit.     Allergies Allergies  Allergen Reactions  . Bactrim [Sulfamethoxazole-Trimethoprim] Rash    Histories Past Medical History:  Diagnosis Date  . Bell's palsy 2015  . Colon polyps   . DVT (deep venous thrombosis) ([place]) 1992   Right calf  . Elevated serum creatinine   . Erectile dysfunction   . HIV infection ([place])   . Hyperlipidemia   . Kidney stones   . Pneumonia    Past Surgical History:  Procedure Laterality Date  . COLONOSCOPY    . INGUINAL HERNIA REPAIR Left   . LITHOTRIPSY     Social History   Socioeconomic History  . Marital status: Single    Spouse name: Not on file  . Number of children: 0  . Years of education: Not on file  . Highest education level: Not on file  Occupational History  . Occupation: [employer] part time  Social Needs  . Financial resource strain: Not on file  . Food insecurity:    Worry: Not on file    Inability: Not on file  . Transportation needs:    Medical: Not on file    Non-medical:  Not on file  Tobacco Use  . Smoking status: Current Every Day Smoker    Packs/day: 0.50    Types: Cigarettes  . Smokeless tobacco: Never Used  . Tobacco comment: trying to quit  Substance and Sexual Activity  . Alcohol use: Yes    Alcohol/week: 2.0 standard drinks    Types: 1 Glasses of wine, 1 Shots of liquor per week    Comment: social  . Drug use: No  . Sexual activity: Yes    Birth control/protection: Condom  Lifestyle  . Physical activity:    Days per week: Not on file    Minutes per session:  Not on file  . Stress: Not on file  Relationships  . Social connections:    Talks on phone: Not on file    Gets together: Not on file    Attends religious service: Not on file    Active member of club or organization: Not on file    Attends meetings of clubs or organizations: Not on file    Relationship status: Not on file  . Intimate partner violence:    Fear of current or ex partner: Not on file    Emotionally abused: Not on file    Physically abused: Not on file    Forced sexual activity: Not on file  Other Topics Concern  . Not on file  Social History Narrative  . Not on file   Family History  Problem Relation Age of Onset  . Colon cancer Mother        dx in her early 74's  . Colon cancer Father        dx in his 73's  . Colon cancer Paternal Grandmother   . Esophageal cancer Neg Hx   . Inflammatory bowel disease Neg Hx   . Liver disease Neg Hx   . Pancreatic cancer Neg Hx   . Stomach cancer Neg Hx    I have reviewed his medical, social, and family history in detail and updated the electronic medical record as necessary.    PHYSICAL EXAMINATION  BP 118/76   Pulse 72   Ht 5\' 6"  (1.676 m)   Wt 166 lb 9.6 oz (75.6 kg)   BMI 26.89 kg/m  Wt Readings from Last 3 Encounters:  08/03/18 166 lb 9.6 oz (75.6 kg)  06/04/18 172 lb (78 kg)  04/15/18 179 lb (81.2 kg)  GEN: NAD, appears stated age, doesn't appear chronically ill PSYCH: Cooperative, without pressured speech EYE: Conjunctivae pink, sclerae anicteric ENT: MMM NECK: Supple CV: RR without R/Gs  RESP: CTAB posteriorly, without wheezing GI: NABS, soft, NT/ND, without rebound or guarding, no HSM appreciated MSK/EXT: No lower extremity edema SKIN: No jaundice NEURO:  Alert & Oriented x 3, no focal deficits   REVIEW OF DATA  I reviewed the following data at the time of this encounter:  GI Procedures and Studies  Reports history of at least 3-4 colonoscopies but no records  Laboratory Studies  Reviewed in  epic  Outside laboratory results from PCP visit on 06/29/2018 (these will be scanned into the chart) Hepatitis B surface antibody positive Hepatitis B surface antigen negative Hepatitis B core antibody IgG/IgM negative Hepatitis C antibody negative ANA negative Ceruloplasmin 35.5 Ferritin 199 Anti-smooth muscle antibody 8 (negative Alpha 1 antitrypsin 134 (within normal limits) We will scan these into the chart  Imaging Studies  No relevant studies to review   ASSESSMENT  Mr. Latterell is a 63 y.o. male with a pmh significant  for FHx of Colon Cancer, prior VTE, CRI, HIV, HLD, Nephrolithiasis.  The patient is seen today for evaluation and management of:  1. Abnormal liver function test   2. Family history of colon cancer   3. High risk for colon cancer    The patient is clinically and hemodynamically stable.  Unfortunately we are unable to obtain any records of his prior colonoscopy which was over 3 years ago.  He was told he needs to have a 3-year follow-up.  Again, as noted in initial consultation I was not completely clear why a 3-year follow-up was indicated however as we have not been able to ascertain whether his preparation was perfect and he had been previously recommended we will proceed with a colonoscopy.  Based on the findings of the colonoscopy and his family history of colon cancer if he has no polyps and he will go back to a 5-year high risk screening surveillance.  In regards to the initial referral with concern for abnormal liver tests we will send a CMP and additional work-up will be made were done based on the findings of his CMP although his last liver tests have been normal in the system.  The risks and benefits of endoscopic evaluation were discussed with the patient; these include but are not limited to the risk of perforation, infection, bleeding, missed lesions, lack of diagnosis, severe illness requiring hospitalization, as well as anesthesia and sedation related  illnesses.  The patient is agreeable to proceed.  All patient questions were answered, to the best of my ability, and the patient agrees to the aforementioned plan of action with follow-up as indicated.   PLAN  Laboratories as outlined below Proceed with scheduling colonoscopy for surveillance   Orders Placed This Encounter  Procedures  . Comprehensive metabolic panel  . Ambulatory referral to Gastroenterology    New Prescriptions   No medications on file   Modified Medications   No medications on file    Planned Follow Up: No follow-ups on file.   Justice Britain, MD [facility] Advanced [facility]oscopy Office # [phone]

## 2018-08-10 ENCOUNTER — Encounter: Payer: Self-pay | Admitting: Gastroenterology

## 2018-08-10 DIAGNOSIS — R945 Abnormal results of liver function studies: Secondary | ICD-10-CM | POA: Insufficient documentation

## 2018-08-10 DIAGNOSIS — Z9189 Other specified personal risk factors, not elsewhere classified: Secondary | ICD-10-CM | POA: Insufficient documentation

## 2018-08-10 DIAGNOSIS — R7989 Other specified abnormal findings of blood chemistry: Secondary | ICD-10-CM | POA: Insufficient documentation

## 2018-09-03 ENCOUNTER — Encounter: Payer: Self-pay | Admitting: Gastroenterology

## 2018-09-03 ENCOUNTER — Ambulatory Visit (AMBULATORY_SURGERY_CENTER): Payer: Federal, State, Local not specified - PPO | Admitting: Gastroenterology

## 2018-09-03 VITALS — BP 117/76 | HR 70 | Temp 99.3°F | Resp 18 | Ht 66.0 in | Wt 166.0 lb

## 2018-09-03 DIAGNOSIS — Z1211 Encounter for screening for malignant neoplasm of colon: Secondary | ICD-10-CM | POA: Diagnosis not present

## 2018-09-03 DIAGNOSIS — Z800 Family history of malignant neoplasm of digestive organs: Secondary | ICD-10-CM | POA: Diagnosis not present

## 2018-09-03 MED ORDER — SODIUM CHLORIDE 0.9 % IV SOLN: 500.0000 mL | INTRAVENOUS | Status: DC

## 2018-09-03 NOTE — Patient Instructions (Signed)
YOU HAD AN ENDOSCOPIC PROCEDURE TODAY AT [facility]:   Refer to the procedure report that was given to you for any specific questions about what was found during the examination.  If the procedure report does not answer your questions, please call your gastroenterologist to clarify.  If you requested that your care partner not be given the details of your procedure findings, then the procedure report has been included in a sealed envelope for you to review at your convenience later.  YOU SHOULD EXPECT: Some feelings of bloating in the abdomen. Passage of more gas than usual.  Walking can help get rid of the air that was put into your GI tract during the procedure and reduce the bloating. If you had a lower endoscopy (such as a colonoscopy or flexible sigmoidoscopy) you may notice spotting of blood in your stool or on the toilet paper. If you underwent a bowel prep for your procedure, you may not have a normal bowel movement for a few days.  Please Note:  You might notice some irritation and congestion in your nose or some drainage.  This is from the oxygen used during your procedure.  There is no need for concern and it should clear up in a day or so.  SYMPTOMS TO REPORT IMMEDIATELY:   Following lower endoscopy (colonoscopy or flexible sigmoidoscopy):  Excessive amounts of blood in the stool  Significant tenderness or worsening of abdominal pains  Swelling of the abdomen that is new, acute  Fever of 100F or higher   For urgent or emergent issues, a gastroenterologist can be reached at any hour by calling [phone].   DIET:  We do recommend a small meal at first, but then you may proceed to your regular diet.  Drink plenty of fluids but you should avoid alcoholic beverages for 24 hours. Follow a High Fiber diet (see handout given to you by your recovery nurse).  MEDICATIONS: Continue present medications. Use Fiber, for example Citrucel, Fibercon, Konsyl, or  Metamucil.  Please see handouts given to you by your recovery nurse.  ACTIVITY:  You should plan to take it easy for the rest of today and you should NOT DRIVE or use heavy machinery until tomorrow (because of the sedation medicines used during the test).    FOLLOW UP: Our staff will call the number listed on your records the next business day following your procedure to check on you and address any questions or concerns that you may have regarding the information given to you following your procedure. If we do not reach you, we will leave a message.  However, if you are feeling well and you are not experiencing any problems, there is no need to return our call.  We will assume that you have returned to your regular daily activities without incident.  If any biopsies were taken you will be contacted by phone or by letter within the next 1-3 weeks.  Please call us at [phone] if you have not heard about the biopsies in 3 weeks.   Thank you for allowing [place] to provide for your healthcare needs today.  SIGNATURES/CONFIDENTIALITY: You and/or your care partner have signed paperwork which will be entered into your electronic medical record.  These signatures attest to the fact that that the information above on your After Visit Summary has been reviewed and is understood.  Full responsibility of the confidentiality of this discharge information lies with you and/or your care-partner.

## 2018-09-03 NOTE — Op Note (Signed)
[facility] Patient Name: Sean Conrad Procedure Date: 09/03/2018 8:01 AM MRN: [id number] Endoscopist: Justice Britain , MD Age: 63 Referring MD:  Date of Birth: 1955/12/08 Gender: Male Account #: [id number] Procedure:                Colonoscopy Indications:              Screening in patient at increased risk: Colorectal                            cancer in mother 64 or older, Screening in patient                            at increased risk: Colorectal cancer in father 25                            or older Medicines:                Monitored Anesthesia Care Procedure:                Pre-Anesthesia Assessment:                           - Prior to the procedure, a History and Physical                            was performed, and patient medications and                            allergies were reviewed. The patient's tolerance of                            previous anesthesia was also reviewed. The risks                            and benefits of the procedure and the sedation                            options and risks were discussed with the patient.                            All questions were answered, and informed consent                            was obtained. Prior Anticoagulants: The patient has                            taken no previous anticoagulant or antiplatelet                            agents. ASA Grade Assessment: II - A patient with                            mild systemic disease. After reviewing the risks  and benefits, the patient was deemed in                            satisfactory condition to undergo the procedure.                           After obtaining informed consent, the colonoscope                            was passed under direct vision. Throughout the                            procedure, the patient's blood pressure, pulse, and                            oxygen saturations were monitored continuously.  The                            Colonoscope was introduced through the anus and                            advanced to the 3 cm into the ileum. The                            colonoscopy was performed without difficulty. The                            patient tolerated the procedure. The quality of the                            bowel preparation was good. The terminal ileum,                            ileocecal valve, appendiceal orifice, and rectum                            were photographed. Scope In: 8:03:38 AM Scope Out: 8:21:12 AM Scope Withdrawal Time: 0 hours 13 minutes 16 seconds  Total Procedure Duration: 0 hours 17 minutes 34 seconds  Findings:                 The digital rectal exam findings include                            non-thrombosed internal hemorrhoids. Pertinent                            negatives include no palpable rectal lesions.                           The terminal ileum and ileocecal valve appeared                            normal.  A moderate amount of liquid stool was found in the                            entire colon, without interference to                            visualization. Lavage of the area was performed                            using copious amounts, resulting in clearance with                            good visualization.                           Normal mucosa was found in the entire colon.                           Non-bleeding non-thrombosed internal hemorrhoids                            were found during retroflexion, during perianal                            exam and during digital exam. The hemorrhoids were                            Grade II (internal hemorrhoids that prolapse but                            reduce spontaneously). Complications:            No immediate complications. Estimated Blood Loss:     Estimated blood loss: none. Impression:               - Non-thrombosed internal hemorrhoids  found on                            digital rectal exam.                           - The examined portion of the ileum was normal.                           - Stool in the entire examined colon.                           - Normal mucosa in the entire examined colon.                           - Non-bleeding non-thrombosed internal hemorrhoids. Recommendation:           - The patient will be observed post-procedure,                            until all discharge criteria are met.                           -  Discharge patient to home.                           - Patient has a contact number available for                            emergencies. The signs and symptoms of potential                            delayed complications were discussed with the                            patient. Return to normal activities tomorrow.                            Written discharge instructions were provided to the                            patient.                           - High fiber diet.                           - Use fiber, for example Citrucel, Fibercon, Konsyl                            or Metamucil.                           - Continue present medications.                           - Repeat colonoscopy in 5 years for screening                            purposes due to family history.                           - The findings and recommendations were discussed                            with the patient. Justice Britain, MD 09/03/2018 8:31:53 AM

## 2018-09-03 NOTE — Progress Notes (Signed)
PT taken to PACU. Monitors in place. VSS. Report given to RN. 

## 2018-09-06 ENCOUNTER — Telehealth: Payer: Self-pay | Admitting: *Deleted

## 2018-09-06 NOTE — Telephone Encounter (Signed)
  Follow up Call-  Call back number 09/03/2018  Post procedure Call Back phone  # [phone]  Permission to leave phone message Yes     Patient questions:  Do you have a fever, pain , or abdominal swelling? No. Pain Score  0 *  Have you tolerated food without any problems? Yes.    Have you been able to return to your normal activities? Yes.    Do you have any questions about your discharge instructions: Diet   No. Medications  No. Follow up visit  No.  Do you have questions or concerns about your Care? No.  Actions: * If pain score is 4 or above: No action needed, pain <4.

## 2018-09-26 ENCOUNTER — Other Ambulatory Visit: Payer: Self-pay

## 2018-09-26 ENCOUNTER — Emergency Department (HOSPITAL_COMMUNITY): Payer: Federal, State, Local not specified - PPO

## 2018-09-26 ENCOUNTER — Encounter (HOSPITAL_COMMUNITY): Payer: Self-pay | Admitting: *Deleted

## 2018-09-26 ENCOUNTER — Emergency Department (HOSPITAL_COMMUNITY)
Admission: EM | Admit: 2018-09-26 | Discharge: 2018-09-26 | Disposition: A | Discharge status: Home / Self Care | Payer: Federal, State, Local not specified - PPO | Attending: Emergency Medicine | Admitting: Emergency Medicine

## 2018-09-26 DIAGNOSIS — K047 Periapical abscess without sinus: Secondary | ICD-10-CM | POA: Insufficient documentation

## 2018-09-26 DIAGNOSIS — J341 Cyst and mucocele of nose and nasal sinus: Secondary | ICD-10-CM | POA: Diagnosis not present

## 2018-09-26 DIAGNOSIS — K0889 Other specified disorders of teeth and supporting structures: Secondary | ICD-10-CM | POA: Diagnosis not present

## 2018-09-26 DIAGNOSIS — F17210 Nicotine dependence, cigarettes, uncomplicated: Secondary | ICD-10-CM | POA: Diagnosis not present

## 2018-09-26 DIAGNOSIS — R531 Weakness: Secondary | ICD-10-CM | POA: Diagnosis not present

## 2018-09-26 DIAGNOSIS — Z79899 Other long term (current) drug therapy: Secondary | ICD-10-CM | POA: Insufficient documentation

## 2018-09-26 DIAGNOSIS — R220 Localized swelling, mass and lump, head: Secondary | ICD-10-CM | POA: Diagnosis not present

## 2018-09-26 LAB — CBC WITH DIFFERENTIAL/PLATELET
Abs Immature Granulocytes: 0.01 10*3/uL (ref 0.00–0.07)
Basophils Absolute: 0 10*3/uL (ref 0.0–0.1)
Basophils Relative: 0 %
EOS PCT: 1 %
Eosinophils Absolute: 0.1 10*3/uL (ref 0.0–0.5)
HEMATOCRIT: 45 % (ref 39.0–52.0)
HEMOGLOBIN: 14.7 g/dL (ref 13.0–17.0)
Immature Granulocytes: 0 %
LYMPHS ABS: 3.6 10*3/uL (ref 0.7–4.0)
LYMPHS PCT: 43 %
MCH: 31.9 pg (ref 26.0–34.0)
MCHC: 32.7 g/dL (ref 30.0–36.0)
MCV: 97.6 fL (ref 80.0–100.0)
Monocytes Absolute: 0.7 10*3/uL (ref 0.1–1.0)
Monocytes Relative: 9 %
Neutro Abs: 4 10*3/uL (ref 1.7–7.7)
Neutrophils Relative %: 47 %
Platelets: 191 10*3/uL (ref 150–400)
RBC: 4.61 MIL/uL (ref 4.22–5.81)
RDW: 13.7 % (ref 11.5–15.5)
WBC: 8.4 10*3/uL (ref 4.0–10.5)
nRBC: 0 % (ref 0.0–0.2)

## 2018-09-26 LAB — BASIC METABOLIC PANEL
Anion gap: 8 (ref 5–15)
BUN: 15 mg/dL (ref 6–20)
CHLORIDE: 104 mmol/L (ref 98–111)
CO2: 25 mmol/L (ref 22–32)
CREATININE: 1.27 mg/dL — AB (ref 0.61–1.24)
Calcium: 9.5 mg/dL (ref 8.9–10.3)
GFR calc non Af Amer: >60 mL/min (ref >60)
Glucose, Bld: 99 mg/dL (ref 70–99)
Potassium: 4.2 mmol/L (ref 3.5–5.1)
SODIUM: 137 mmol/L (ref 135–145)

## 2018-09-26 MED ORDER — IOHEXOL 300 MG/ML  SOLN
75.0000 mL | Freq: Once | INTRAMUSCULAR | Status: AC | PRN
  Administered 2018-09-26: 75 mL via INTRAVENOUS

## 2018-09-26 MED ORDER — CLINDAMYCIN HCL 150 MG PO CAPS
300.0000 mg | ORAL_CAPSULE | Freq: Once | ORAL | Status: AC
  Administered 2018-09-26: 300 mg via ORAL
  Filled 2018-09-26: qty 2

## 2018-09-26 MED ORDER — ONDANSETRON 4 MG PO TBDP: 4.0000 mg | ORAL_TABLET | Freq: Three times a day (TID) | ORAL | 0 refills | Status: DC | PRN

## 2018-09-26 MED ORDER — CLINDAMYCIN HCL 300 MG PO CAPS: 300.0000 mg | ORAL_CAPSULE | Freq: Three times a day (TID) | ORAL | 0 refills | Status: AC

## 2018-09-26 NOTE — ED Provider Notes (Signed)
[facility] EMERGENC[facility] DEPARTMENT Provider Note   CSN: [id number] Arrival date & time: 09/26/18  1807    History   Chief Complaint Chief Complaint  Patient presents with  . Facial Swelling    HPI Sean Conrad is a 63 y.o. male.     HPI   Sean Conrad is a 63 y.o. male, with a history of Bell's palsy, DVT, HIV, presenting to the ED with weakness to the left side of the face beginning yesterday.  Patient states he was last normal around 5 AM yesterday morning.  Began to feel a sensation as if the left side of his face was swelling, but then noted the left side was not moving as well.  He has a past history of Bell's palsy and states the symptoms are similar, however, he also mentions he began to have left maxillary tooth pain prior to onset of other symptoms.  Denies fever/chills, recent illness, vision abnormalities, hearing abnormalities, dizziness, extremity numbness/weakness, difficulty swallowing, changes in taste, shortness of breath, chest pain, falls/trauma, headache, speech abnormality, or any other complaints.   Last CD4 count and HIV quant performed September 2019 and were 1110 and undetectable, respectively.  Past Medical History:  Diagnosis Date  . Bell's palsy 2015  . Colon polyps   . DVT (deep venous thrombosis) ([place]) 1992   Right calf  . Elevated serum creatinine   . Erectile dysfunction   . HIV infection ([place])   . Hyperlipidemia   . Kidney stones   . Pneumonia     Patient Active Problem List   Diagnosis Date Noted  . High risk for colon cancer 08/10/2018  . Abnormal liver function test 08/10/2018  . Family history of colon cancer 06/08/2018  . History of colonic polyps 06/08/2018  . Strain of lumbar region 04/15/2018  . Erectile dysfunction 10/06/2017  . Healthcare maintenance 06/08/2017  . HIV (human immunodeficiency virus infection) ([place]) 04/23/2017  . Creatinine elevation 04/23/2017  . Tobacco abuse 04/23/2017    Past  Surgical History:  Procedure Laterality Date  . COLONOSCOPY    . INGUINAL HERNIA REPAIR Left   . LITHOTRIPSY          Home Medications    Prior to Admission medications   Medication Sig Start Date End Date Taking? Authorizing Provider  clindamycin (CLEOCIN) 300 MG capsule Take 1 capsule (300 mg total) by mouth 3 (three) times daily for 7 days. 09/26/18 10/03/18  Eean Buss C, PA-C  diclofenac sodium (VOLTAREN) 1 % GEL Apply 4 g topically 4 (four) times daily as needed. 04/15/18   [place] Callas, NP  Krill Oil 1000 MG CAPS Take by mouth.    [provider]  Multiple Vitamins-Minerals (CENTRUM SILVER 50+MEN PO) Take by mouth.    [provider]  ondansetron (ZOFRAN ODT) 4 MG disintegrating tablet Take 1 tablet (4 mg total) by mouth every 8 (eight) hours as needed for nausea or vomiting. 09/26/18   Lorayne Bender, PA-C  PREZCOBIX 800-150 MG tablet Take 1 tablet by mouth daily with breakfast. 04/15/18   [place] Callas, NP  sildenafil (VIAGRA) 100 MG tablet Take 0.5-1 tablets (50-100 mg total) by mouth as needed for erectile dysfunction. 04/15/18   [place] Callas, NP  sildenafil (VIAGRA) 50 MG tablet Take 50-100 mg by mouth See admin instructions. 20 mins prior to 2/24 hrs not to exceed 09/24/18   [provider]  TIVICAY 50 MG tablet Take 1 tablet (50 mg total) by mouth  daily. 04/15/18   [place] Callas, NP    Family History Family History  Problem Relation Age of Onset  . Colon cancer Mother        dx in her early 84's  . Colon cancer Father        dx in his 48's  . Colon cancer Paternal Grandmother   . Esophageal cancer Neg Hx   . Inflammatory bowel disease Neg Hx   . Liver disease Neg Hx   . Pancreatic cancer Neg Hx   . Stomach cancer Neg Hx     Social History Social History   Tobacco Use  . Smoking status: Current Every Day Smoker    Packs/day: 0.50    Types: Cigarettes  . Smokeless tobacco: Never Used  . Tobacco comment: trying to quit    Substance Use Topics  . Alcohol use: Yes    Alcohol/week: 2.0 standard drinks    Types: 1 Glasses of wine, 1 Shots of liquor per week    Comment: social  . Drug use: No     Allergies   Bactrim [sulfamethoxazole-trimethoprim]   Review of Systems Review of Systems  Constitutional: Negative for chills and fever.  HENT: Positive for dental problem. Negative for drooling.   Eyes: Negative for visual disturbance.  Respiratory: Negative for shortness of breath.   Cardiovascular: Negative for chest pain.  Gastrointestinal: Negative for abdominal pain, nausea and vomiting.  Musculoskeletal: Negative for neck pain.  Neurological: Positive for facial asymmetry. Negative for syncope, speech difficulty, weakness, light-headedness, numbness and headaches.  Psychiatric/Behavioral: Negative for confusion.  All other systems reviewed and are negative.    Physical Exam Updated Vital Signs BP 121/83 (BP Location: Left Arm)   Pulse 90   Temp 99 F (37.2 C) (Oral)   Resp 18   Ht 5\' 6"  (1.676 m)   Wt 79.4 kg   SpO2 99%   BMI 28.25 kg/m   Physical Exam Vitals signs and nursing note reviewed.  Constitutional:      General: He is not in acute distress.    Appearance: He is well-developed. He is not diaphoretic.  HENT:     Head: Normocephalic and atraumatic.     Right Ear: Tympanic membrane, ear canal and external ear normal.     Left Ear: Tympanic membrane, ear canal and external ear normal.     Nose: Mucosal edema present.     Mouth/Throat:     Mouth: Mucous membranes are moist.     Pharynx: Oropharynx is clear.     Comments: Patient does have tenderness along the left maxillary gingiva in the area of the incisors and canine.  Dentition appears to be intact and stable.  No noted area of swelling or fluctuance.  No trismus.  Mouth opening to at least 3 finger widths.  Handles oral secretions without difficulty.  No noted facial swelling.  No sublingual swelling.  No swelling or  tenderness to the submental or submandibular regions.  No swelling or tenderness into the soft tissues of the neck. Eyes:     Extraocular Movements: Extraocular movements intact.     Conjunctiva/sclera: Conjunctivae normal.     Pupils: Pupils are equal, round, and reactive to light.  Neck:     Musculoskeletal: Neck supple.  Cardiovascular:     Rate and Rhythm: Normal rate and regular rhythm.     Pulses: Normal pulses.     Heart sounds: Normal heart sounds.     Comments: Tactile temperature in the  extremities appropriate and equal bilaterally. Pulmonary:     Effort: Pulmonary effort is normal. No respiratory distress.     Breath sounds: Normal breath sounds.  Abdominal:     Palpations: Abdomen is soft.     Tenderness: There is no abdominal tenderness. There is no guarding.  Musculoskeletal:     Right lower leg: No edema.     Left lower leg: No edema.  Lymphadenopathy:     Cervical: No cervical adenopathy.  Skin:    General: Skin is warm and dry.  Neurological:     Mental Status: He is alert and oriented to person, place, and time.     Comments: Patient has left-sided facial weakness.  Can furrow his brow and raise eyebrows, seems to be equal bilaterally.  Cranial nerves III through XII otherwise grossly intact. No noted speech deficits. No aphasia. Patient handles oral secretions without difficulty. No noted swallowing defects.  Equal grip strength bilaterally. No arm drift. Strength 5/5 in the upper extremities. Strength 5/5 in the lower extremities. Negative Romberg. No gait disturbance.  Coordination intact including heel to shin and finger to nose.  No noted visual field deficit.  Psychiatric:        Mood and Affect: Mood and affect normal.        Speech: Speech normal.        Behavior: Behavior normal.      ED Treatments / Results  Labs (all labs ordered are listed, but only abnormal results are displayed) Labs Reviewed  BASIC METABOLIC PANEL - Abnormal; Notable  for the following components:      Result Value   Creatinine, Ser 1.27 (*)    All other components within normal limits  CBC WITH DIFFERENTIAL/PLATELET   BUN  Date Value Ref Range Status  09/26/2018 15 6 - 20 mg/dL Final  08/03/2018 14 6 - 23 mg/dL Final  04/01/2018 12 7 - 25 mg/dL Final  09/22/2017 17 7 - 25 mg/dL Final   Creat  Date Value Ref Range Status  04/01/2018 1.35 (H) 0.70 - 1.33 mg/dL Final    Comment:    For patients >34 years of age, the reference limit for Creatinine is approximately 13% higher for people identified as African-American. Marland Kitchen   09/22/2017 1.48 (H) 0.70 - 1.33 mg/dL Final    Comment:    For patients >7 years of age, the reference limit for Creatinine is approximately 13% higher for people identified as African-American. .   05/25/2017 1.31 0.70 - 1.33 mg/dL Final    Comment:    For patients >85 years of age, the reference limit for Creatinine is approximately 13% higher for people identified as African-American. .   04/09/2017 1.41 (H) 0.70 - 1.33 mg/dL Final    Comment:    For patients >58 years of age, the reference limit for Creatinine is approximately 13% higher for people identified as African-American. .    Creatinine, Ser  Date Value Ref Range Status  09/26/2018 1.27 (H) 0.61 - 1.24 mg/dL Final  08/03/2018 1.04 0.40 - 1.50 mg/dL Final     EKG None  Radiology Ct Head Wo Contrast  Result Date: 09/26/2018 CLINICAL DATA:  63 y/o M; 2 days of left facial swelling. History of Bell's palsy with deficits in the left-sided face. EXAM: CT HEAD WITHOUT CONTRAST CT MAXILLOFACIAL WITH CONTRAST TECHNIQUE: Multidetector CT imaging of the maxillofacial structures was performed using the standard protocol with intravenous contrast. Multiplanar CT image reconstructions of the maxillofacial structures were also  generated. Multidetector CT imaging of the head was performed using the standard protocol without intravenous contrast. Multiplanar CT  image reconstructions of the maxillofacial structures were also generated. COMPARISON:  None. CONTRAST:  75 cc Omnipaque 300 FINDINGS: CT HEAD FINDINGS Brain: No evidence of acute infarction, hemorrhage, hydrocephalus, extra-axial collection or mass lesion/mass effect. Vascular: No hyperdense vessel or unexpected calcification. Skull: Normal. Negative for fracture or focal lesion. Other: None. CT MAXILLOFACIAL FINDINGS Osseous: Periapical lucency associated with the left maxillary canine. Overlying 7 mm rim enhancing collection in soft tissues and surrounding inflammatory changes (series 8, image 45, series 11, image 55, and series 12, image 27). Orbits: Negative. No traumatic or inflammatory finding. Sinuses: Mild left maxillary sinus mucosal thickening. Left posterior ethmoid air cell mucous retention cyst. No sinus fluid levels. Normal aeration of mastoid air cells. Soft tissues: Negative. IMPRESSION: 1. Negative CT of the head. 2. 7 mm odontogenic abscess overlying the left maxillary alveolar bone at base of nose associated with periapical disease of the left maxillary canine. Surrounding inflammatory changes of superficial soft tissues. Electronically Signed   By: Kristine Garbe M.D.   On: 09/26/2018 21:55   Ct Maxillofacial W Contrast  Result Date: 09/26/2018 CLINICAL DATA:  63 y/o M; 2 days of left facial swelling. History of Bell's palsy with deficits in the left-sided face. EXAM: CT HEAD WITHOUT CONTRAST CT MAXILLOFACIAL WITH CONTRAST TECHNIQUE: Multidetector CT imaging of the maxillofacial structures was performed using the standard protocol with intravenous contrast. Multiplanar CT image reconstructions of the maxillofacial structures were also generated. Multidetector CT imaging of the head was performed using the standard protocol without intravenous contrast. Multiplanar CT image reconstructions of the maxillofacial structures were also generated. COMPARISON:  None. CONTRAST:  75 cc  Omnipaque 300 FINDINGS: CT HEAD FINDINGS Brain: No evidence of acute infarction, hemorrhage, hydrocephalus, extra-axial collection or mass lesion/mass effect. Vascular: No hyperdense vessel or unexpected calcification. Skull: Normal. Negative for fracture or focal lesion. Other: None. CT MAXILLOFACIAL FINDINGS Osseous: Periapical lucency associated with the left maxillary canine. Overlying 7 mm rim enhancing collection in soft tissues and surrounding inflammatory changes (series 8, image 45, series 11, image 55, and series 12, image 27). Orbits: Negative. No traumatic or inflammatory finding. Sinuses: Mild left maxillary sinus mucosal thickening. Left posterior ethmoid air cell mucous retention cyst. No sinus fluid levels. Normal aeration of mastoid air cells. Soft tissues: Negative. IMPRESSION: 1. Negative CT of the head. 2. 7 mm odontogenic abscess overlying the left maxillary alveolar bone at base of nose associated with periapical disease of the left maxillary canine. Surrounding inflammatory changes of superficial soft tissues. Electronically Signed   By: Kristine Garbe M.D.   On: 09/26/2018 21:55    Procedures Procedures (including critical care time)  Medications Ordered in ED Medications  iohexol (OMNIPAQUE) 300 MG/ML solution 75 mL (75 mLs Intravenous Contrast Given 09/26/18 2110)  clindamycin (CLEOCIN) capsule 300 mg (300 mg Oral Given 09/26/18 2244)     Initial Impression / Assessment and Plan / ED Course  I have reviewed the triage vital signs and the nursing notes.  Pertinent labs & imaging results that were available during my care of the patient were reviewed by me and considered in my medical decision making (see chart for details).        Patient presents with a complaint of left-sided facial weakness, but also complains of left-sided dental pain.  He was ultimately noted to have a left maxillary dental abscess on CT. Patient is nontoxic appearing,  afebrile, not  tachycardic, not tachypneic, not hypotensive, maintains excellent SPO2 on room air, and is in no apparent distress.  No leukocytosis.  His HIV appears to be well maintained.  We will follow-up with the oral surgeon. The patient was given instructions for home care as well as return precautions. Patient voices understanding of these instructions, accepts the plan, and is comfortable with discharge.    Findings and plan of care discussed with Pattricia Boss, MD. Dr. Jeanell Sparrow personally evaluated and examined this patient.  Vitals:   09/26/18 1812 09/26/18 1813 09/26/18 2248  BP: 121/83  118/86  Pulse: 90  74  Resp: 18  16  Temp: 99 F (37.2 C)    TempSrc: Oral    SpO2: 99%  99%  Weight:  79.4 kg   Height:  5\' 6"  (1.676 m)      Final Clinical Impressions(s) / ED Diagnoses   Final diagnoses:  Dental abscess    ED Discharge Orders         Ordered    clindamycin (CLEOCIN) 300 MG capsule  3 times daily    Note to Pharmacy:  May dispense as 150 mg capsules if more economical for the patient.   09/26/18 2224    ondansetron (ZOFRAN ODT) 4 MG disintegrating tablet  Every 8 hours PRN     09/26/18 2224           Layla Maw 09/26/18 2306    Pattricia Boss, MD 09/27/18 1431

## 2018-09-26 NOTE — ED Notes (Signed)
Patient transported to CT 

## 2018-09-26 NOTE — ED Triage Notes (Signed)
Pt reports L facial swelling x 2 days.  Denies toothache.  Hx of bell's palsy in the past with deficits on the L side of his face.  Pt also endorses L facial ache.

## 2018-09-26 NOTE — Discharge Instructions (Addendum)
°  Dental Pain You have been seen today for dental pain. You should follow up with a dentist as soon as possible. This problem will not resolve on its own without the care of a dentist. Use ibuprofen or naproxen for pain. Use the viscous lidocaine for mouth pain. Swish with the lidocaine and spit it out. Do not swallow it. You should also swish with a homemade salt water solution, twice a day.  Make this solution by mixing 8 ounces of warm water with about half a teaspoon of salt. Antiinflammatory medications: Take 600 mg of ibuprofen every 6 hours or 440 mg (over the counter dose) to 500 mg (prescription dose) of naproxen every 12 hours for the next 3 days. After this time, these medications may be used as needed for pain. Take these medications with food to avoid upset stomach. Choose only one of these medications, do not take them together. Acetaminophen (generic for Tylenol): Should you continue to have additional pain while taking the ibuprofen or naproxen, you may add in acetaminophen as needed. Your daily total maximum amount of acetaminophen from all sources should be limited to 4000mg /day for persons without liver problems, or 2000mg /day for those with liver problems.  Please take all of your antibiotics until finished!   You may develop abdominal discomfort or diarrhea from the antibiotic.  You may help offset this with probiotics which you can buy or get in yogurt. Do not eat or take the probiotics until 2 hours after your antibiotic.   For prescription assistance, may try using prescription discount sites or apps, such as goodrx.com

## 2018-09-28 DIAGNOSIS — K080 Exfoliation of teeth due to systemic causes: Secondary | ICD-10-CM | POA: Diagnosis not present

## 2018-09-30 ENCOUNTER — Other Ambulatory Visit: Payer: Federal, State, Local not specified - PPO

## 2018-09-30 DIAGNOSIS — R7989 Other specified abnormal findings of blood chemistry: Secondary | ICD-10-CM

## 2018-09-30 DIAGNOSIS — Z21 Asymptomatic human immunodeficiency virus [HIV] infection status: Secondary | ICD-10-CM | POA: Diagnosis not present

## 2018-10-01 LAB — T-HELPER CELL (CD4) - (RCID CLINIC ONLY)
CD4 % Helper T Cell: 24 % — ABNORMAL LOW (ref 33–55)
CD4 T Cell Abs: 910 /uL (ref 400–2700)

## 2018-10-01 LAB — CBC
HCT: 40.8 % (ref 38.5–50.0)
Hemoglobin: 14.3 g/dL (ref 13.2–17.1)
MCH: 33.2 pg — AB (ref 27.0–33.0)
MCHC: 35 g/dL (ref 32.0–36.0)
MCV: 94.7 fL (ref 80.0–100.0)
MPV: 10 fL (ref 7.5–12.5)
Platelets: 207 10*3/uL (ref 140–400)
RBC: 4.31 10*6/uL (ref 4.20–5.80)
RDW: 13 % (ref 11.0–15.0)
WBC: 7.4 10*3/uL (ref 3.8–10.8)

## 2018-10-01 LAB — COMPREHENSIVE METABOLIC PANEL
AG Ratio: 1.4 (calc) (ref 1.0–2.5)
ALT: 20 U/L (ref 9–46)
AST: 27 U/L (ref 10–35)
Albumin: 4 g/dL (ref 3.6–5.1)
Alkaline phosphatase (APISO): 74 U/L (ref 35–144)
BUN: 16 mg/dL (ref 7–25)
CO2: 30 mmol/L (ref 20–32)
CREATININE: 1.05 mg/dL (ref 0.70–1.25)
Calcium: 9.8 mg/dL (ref 8.6–10.3)
Chloride: 104 mmol/L (ref 98–110)
Globulin: 2.8 g/dL (calc) (ref 1.9–3.7)
Glucose, Bld: 97 mg/dL (ref 65–99)
Potassium: 4.7 mmol/L (ref 3.5–5.3)
SODIUM: 139 mmol/L (ref 135–146)
Total Bilirubin: 0.5 mg/dL (ref 0.2–1.2)
Total Protein: 6.8 g/dL (ref 6.1–8.1)

## 2018-10-01 LAB — LIPID PANEL
Cholesterol: 193 mg/dL (ref <200)
HDL: 63 mg/dL (ref >=40)
LDL Cholesterol (Calc): 114 mg/dL (calc) — ABNORMAL HIGH
NON-HDL CHOLESTEROL (CALC): 130 mg/dL — AB (ref <130)
Total CHOL/HDL Ratio: 3.1 (calc) (ref <5.0)
Triglycerides: 68 mg/dL (ref <150)

## 2018-10-01 LAB — RPR: RPR Ser Ql: NONREACTIVE

## 2018-10-04 LAB — HIV-1 RNA QUANT-NO REFLEX-BLD
HIV 1 RNA Quant: <20 copies/mL — AB
HIV-1 RNA Quant, Log: <1.3 Log copies/mL — AB

## 2018-10-04 LAB — URINALYSIS
Bilirubin Urine: NEGATIVE
Glucose, UA: NEGATIVE
Hgb urine dipstick: NEGATIVE
Ketones, ur: NEGATIVE
Nitrite: NEGATIVE
Protein, ur: NEGATIVE
Specific Gravity, Urine: 1.008 (ref 1.001–1.03)
pH: 6.5 (ref 5.0–8.0)

## 2018-10-21 NOTE — Progress Notes (Deleted)
.  virtu

## 2018-10-25 ENCOUNTER — Encounter: Payer: Federal, State, Local not specified - PPO | Admitting: Infectious Diseases

## 2018-10-27 ENCOUNTER — Encounter: Payer: Self-pay | Admitting: Infectious Diseases

## 2018-10-27 ENCOUNTER — Other Ambulatory Visit: Payer: Self-pay

## 2018-10-27 ENCOUNTER — Ambulatory Visit (INDEPENDENT_AMBULATORY_CARE_PROVIDER_SITE_OTHER): Payer: Federal, State, Local not specified - PPO | Admitting: Infectious Diseases

## 2018-10-27 DIAGNOSIS — Z21 Asymptomatic human immunodeficiency virus [HIV] infection status: Secondary | ICD-10-CM

## 2018-10-27 DIAGNOSIS — Z800 Family history of malignant neoplasm of digestive organs: Secondary | ICD-10-CM | POA: Diagnosis not present

## 2018-10-27 DIAGNOSIS — Z0000 Encounter for general adult medical examination without abnormal findings: Secondary | ICD-10-CM

## 2018-10-27 MED ORDER — PREZCOBIX 800-150 MG PO TABS: 1.0000 | ORAL_TABLET | Freq: Every day | ORAL | 2 refills | Status: DC

## 2018-10-27 MED ORDER — TIVICAY 50 MG PO TABS: 50.0000 mg | ORAL_TABLET | Freq: Every day | ORAL | 2 refills | Status: DC

## 2018-10-27 NOTE — Assessment & Plan Note (Addendum)
[name] continues to have very good control over his HIV on salvage regimen of Prezcobix and Tivicay.  We will continue both of these medicines for him once daily with food.  We reviewed all lab work.  I welcomed and answered all questions.  MyChart access sent.  He will return in 6 months for follow-up with blood work prior to the visit.  He will call if he needs an office visit sooner.  His cholesterol has improved significantly with his weight loss.  I discussed and compared results to prior labs and congratulated him today.

## 2018-10-27 NOTE — Progress Notes (Signed)
Name: Sean Conrad  CWU:[id number]   DOB: [birth year]   PCP: Haywood Pao, MD   Virtual Visit via Telephone Note  I connected with Sean Conrad on 10/27/18 at  8:45 AM EDT by telephone and verified that I am speaking with the correct person using two identifiers.   I discussed the limitations, risks, security and privacy concerns of performing an evaluation and management service by telephone and the availability of in person appointments. I also discussed with the patient that there may be a patient responsible charge related to this service. The patient expressed understanding and agreed to proceed.   Chief Complaint  Patient presents with  . EVISIT    recent dental abscess, had root canal     History of Present Illness: Sean Conrad transferred care in August 2018 from [place] area (Dr. Ramond Dial MD). Retired since 2017. Dx Feb 1995 and   Previous Medications:   Truvada, AZT, Stribild and others he cannot recall.   Genotype:   "many" per his report. None were sent with transfer documentation.    Sean Conrad is doing very well.  He tells me he had a colonoscopy recently with good news.  Follow-up has been arranged with his GI team. Interval history noted for ER visit 09/26/18 with L sided facial droop/Palsey Sx. CT scan was done and revealed large left maxillary abscess that was quite extensive. He followed up with an oral surgeon and had a root canal and was treated with clindamycin.  He is finally able to open his mouth back to normal and chew without pain.  He has continued taking his Prezcibix + Tivicay once daily with food as prescribed with no missed doses.  He has no concerns with side effects to medications or access.  No changes to insurance.  He has had some weight loss associated with increased mobility at work.  He is happy about this change.  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   Medical/surgical/social/family history have been updated  during today's visit.     Observations/Objective: Sean Conrad is doing very well on his medications.  He sounds to be in good spirits today on the phone.  We reviewed all lab work from most recent lab draw.  Welcomed and answered all questions.  HIV 1 RNA Quant (copies/mL)  Date Value  09/30/2018 <20 DETECTED (A)  04/01/2018 <20 NOT DETECTED  09/22/2017 33 (H)   CD4 T Cell Abs (/uL)  Date Value  09/30/2018 910  04/01/2018 1,110  09/22/2017 1,320    Lab Results  Component Value Date   CREATININE 1.05 09/30/2018   CREATININE 1.27 (H) 09/26/2018   CREATININE 1.04 08/03/2018    Lab Results  Component Value Date   WBC 7.4 09/30/2018   HGB 14.3 09/30/2018   HCT 40.8 09/30/2018   MCV 94.7 09/30/2018   PLT 207 09/30/2018    Lab Results  Component Value Date   ALT 20 09/30/2018   AST 27 09/30/2018   ALKPHOS 84 08/03/2018   BILITOT 0.5 09/30/2018     Assessment and Plan: Problem List Items Addressed This Visit      Unprioritized   HIV (human immunodeficiency virus infection) ([place]) (Chronic)    Sean Conrad continues to have very good control over his HIV on salvage regimen of Prezcobix and Tivicay.  We will continue both of these medicines for him once daily with food.  We reviewed all lab work.  I welcomed and answered all questions.  MyChart access sent.  He will return in 6 months for follow-up with blood work prior to the visit.  He will call if he needs an office visit sooner.  His cholesterol has improved significantly with his weight loss.  I discussed and compared results to prior labs and congratulated him today.      Relevant Medications   TIVICAY 50 MG tablet   PREZCOBIX 800-150 MG tablet   Healthcare maintenance    Up-to-date on current recommendations for screening given his age and HIV.  He needs a tetanus booster, will discuss with PCP at next office visit.      Family history of colon cancer    His most recent colonoscopy was clean.  He has follow-up arranged with  her GI team.         Follow Up Instructions: I discussed the assessment and treatment plan with the patient. The patient was provided an opportunity to ask questions and all were answered. The patient agreed with the plan and demonstrated an understanding of the instructions.   The patient was advised to call back or seek an in-person evaluation if the symptoms worsen or if the condition fails to improve as anticipated.  I provided 20 minutes of non-face-to-face time during this encounter.   Janene Madeira, MSN, NP-C [facility] [email or web address] Pager: [phone] Office: [phone] [place]: [phone]

## 2018-10-27 NOTE — Assessment & Plan Note (Signed)
His most recent colonoscopy was clean.  He has follow-up arranged with her GI team.

## 2018-10-27 NOTE — Assessment & Plan Note (Signed)
Up-to-date on current recommendations for screening given his age and HIV.  He needs a tetanus booster, will discuss with PCP at next office visit.

## 2019-03-22 ENCOUNTER — Ambulatory Visit (HOSPITAL_COMMUNITY)
Admission: EM | Admit: 2019-03-22 | Discharge: 2019-03-22 | Disposition: A | Discharge status: Home / Self Care | Payer: Federal, State, Local not specified - PPO | Attending: Urgent Care | Admitting: Urgent Care

## 2019-03-22 ENCOUNTER — Encounter (HOSPITAL_COMMUNITY): Payer: Self-pay | Admitting: Urgent Care

## 2019-03-22 ENCOUNTER — Other Ambulatory Visit: Payer: Self-pay

## 2019-03-22 ENCOUNTER — Encounter (INDEPENDENT_AMBULATORY_CARE_PROVIDER_SITE_OTHER): Payer: Self-pay

## 2019-03-22 ENCOUNTER — Ambulatory Visit (HOSPITAL_COMMUNITY)
Admission: RE | Admit: 2019-03-22 | Discharge: 2019-03-22 | Disposition: A | Discharge status: Home / Self Care | Payer: Federal, State, Local not specified - PPO | Source: Ambulatory Visit | Attending: Urgent Care | Admitting: Urgent Care

## 2019-03-22 DIAGNOSIS — B20 Human immunodeficiency virus [HIV] disease: Secondary | ICD-10-CM

## 2019-03-22 DIAGNOSIS — Z86718 Personal history of other venous thrombosis and embolism: Secondary | ICD-10-CM

## 2019-03-22 DIAGNOSIS — R52 Pain, unspecified: Secondary | ICD-10-CM

## 2019-03-22 DIAGNOSIS — I82431 Acute embolism and thrombosis of right popliteal vein: Secondary | ICD-10-CM

## 2019-03-22 DIAGNOSIS — M79661 Pain in right lower leg: Secondary | ICD-10-CM

## 2019-03-22 MED ORDER — RIVAROXABAN (XARELTO) VTE STARTER PACK (15 & 20 MG): ORAL_TABLET | ORAL | 0 refills | Status: DC

## 2019-03-22 NOTE — Progress Notes (Signed)
Right lower extremity venous duplex completed. Preliminiary results in Chart review CV proc. [place] Archie Atilano,RVS 03/22/2019, 10:48 AM

## 2019-03-22 NOTE — ED Provider Notes (Addendum)
MRN: [id number] DOB: 1955/10/24  Subjective:   Sean Conrad is a 63 y.o. male presenting for 2-day history of recurrent moderate to severe pain of right lower leg.  Patient reports that the pain has been intermittent, worse last night, was severe and did not let him sleep.  He has a history of DVT in the same leg, last episode was about 15 years ago.  He is not on any medications currently for this, has not needed them.  He does have a history of HIV and is compliant with his visits.  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  He admits that he was doing a lot of physical activity that he was not used to, does not hydrate well at all.  He is no longer working for them but his joint pains continue including low back, hips, knees but his primary concern is to make sure he does not have a recurrent DVT.  He has used over-the-counter NSAIDs with minimal relief.   No current facility-administered medications for this encounter.   Current Outpatient Medications:  .  diclofenac sodium (VOLTAREN) 1 % GEL, Apply 4 g topically 4 (four) times daily as needed., Disp: 100 g, Rfl: 2 .  Krill Oil 1000 MG CAPS, Take by mouth., Disp: , Rfl:  .  Multiple Vitamins-Minerals (CENTRUM SILVER 50+MEN PO), Take by mouth., Disp: , Rfl:  .  ondansetron (ZOFRAN ODT) 4 MG disintegrating tablet, Take 1 tablet (4 mg total) by mouth every 8 (eight) hours as needed for nausea or vomiting. (Patient not taking: Reported on 10/27/2018), Disp: 20 tablet, Rfl: 0 .  PREZCOBIX 800-150 MG tablet, Take 1 tablet by mouth daily with breakfast., Disp: 90 tablet, Rfl: 2 .  sildenafil (VIAGRA) 50 MG tablet, Take 50-100 mg by mouth See admin instructions. 20 mins prior to 2/24 hrs not to exceed, Disp: , Rfl:  .  TIVICAY 50 MG tablet, Take 1 tablet (50 mg total) by mouth daily., Disp: 90 tablet, Rfl: 2    Allergies  Allergen Reactions  . Bactrim  [Sulfamethoxazole-Trimethoprim] Rash    Past Medical History:  Diagnosis Date  . Bell's palsy 2015  . Colon polyps   . DVT (deep venous thrombosis) ([place]) 1992   Right calf  . Elevated serum creatinine   . Erectile dysfunction   . HIV infection ([place])   . Hyperlipidemia   . Kidney stones   . Pneumonia      Past Surgical History:  Procedure Laterality Date  . COLONOSCOPY    . INGUINAL HERNIA REPAIR Left   . LITHOTRIPSY      Family History  Problem Relation Age of Onset  . Colon cancer Mother        dx in her early 55's  . Colon cancer Father        dx in his 37's  . Colon cancer Paternal Grandmother   . Esophageal cancer Neg Hx   . Inflammatory bowel disease Neg Hx   . Liver disease Neg Hx   . Pancreatic cancer Neg Hx   . Stomach cancer Neg Hx     ROS Denies fever, chest pain, shortness of breath, heart racing, nausea, vomiting, abdominal pain.  Objective:   Vitals: BP (!) 140/92 (BP Location: Right Arm)   Pulse 85   Temp 98.3 F (36.8 C) (Oral)   SpO2 100%   Physical Exam Constitutional:  Appearance: Normal appearance. He is well-developed and normal weight.  HENT:     Head: Normocephalic and atraumatic.     Right Ear: External ear normal.     Left Ear: External ear normal.     Nose: Nose normal.     Mouth/Throat:     Pharynx: Oropharynx is clear.  Eyes:     Extraocular Movements: Extraocular movements intact.     Pupils: Pupils are equal, round, and reactive to light.  Cardiovascular:     Rate and Rhythm: Normal rate.  Pulmonary:     Effort: Pulmonary effort is normal.  Musculoskeletal:        General: No swelling, tenderness, deformity or signs of injury.     Right lower leg: He exhibits no bony tenderness, no swelling, no deformity and no laceration. Tenderness: Over area depicted. No edema.     Left lower leg: No edema.       Legs:     Comments: Negative Homans sign.  Skin:    General: Skin is warm and dry.     Findings: No erythema or  rash.  Neurological:     Mental Status: He is alert and oriented to person, place, and time.     Cranial Nerves: No cranial nerve deficit.     Coordination: Coordination normal.  Psychiatric:        Mood and Affect: Mood normal.        Behavior: Behavior normal.        Thought Content: Thought content normal.        Judgment: Judgment normal.     Assessment and Plan :   1. Pain of right lower leg   2. History of DVT (deep vein thrombosis)   3. HIV disease ([place])     Suspect patient has musculoskeletal type pain related to the nature of his work and lack of hydration.  However, given his history of DVT will rule out with ultrasound.  Otherwise, use meloxicam, Flexeril, hydration and conservative management to address suspected shinsplints. Counseled patient on potential for adverse effects with medications prescribed/recommended today, ER and return-to-clinic precautions discussed, patient verbalized understanding.    Jaynee Eagles, [place] 03/22/19 [id number]    UPDATE:   1. Pain of right lower leg   2. History of DVT (deep vein thrombosis)   3. HIV disease ([place])   4. Acute deep vein thrombosis (DVT) of popliteal vein of right lower extremity (HCC)    Verbal report from ultrasound technician confirmed that patient has a chronic DVT and a new acute DVT in right lower leg.  Official reading has not yet posted.  However, I have discussed these preliminary results with patient and he is agreeable to starting Xarelto to help with this.  He is going to follow-up with vascular surgery.  Information provided on his visit summary which the patient will access through my chart.  Strict ER precautions reviewed with patient.   Jaynee Eagles, PA-C 03/22/19 1104

## 2019-03-22 NOTE — ED Triage Notes (Signed)
Pt presents to UC with leg pain since 2 days ago. States he woke up Sunday morning with right leg pain. States he had a blood clot in right leg 15 years ago. Pt reports taking Aleve and ibuprofen without relief.

## 2019-03-29 ENCOUNTER — Telehealth (HOSPITAL_COMMUNITY): Payer: Self-pay | Admitting: Emergency Medicine

## 2019-03-29 DIAGNOSIS — I82401 Acute embolism and thrombosis of unspecified deep veins of right lower extremity: Secondary | ICD-10-CM | POA: Diagnosis not present

## 2019-03-29 DIAGNOSIS — M79604 Pain in right leg: Secondary | ICD-10-CM | POA: Diagnosis not present

## 2019-03-29 DIAGNOSIS — B20 Human immunodeficiency virus [HIV] disease: Secondary | ICD-10-CM | POA: Diagnosis not present

## 2019-03-29 DIAGNOSIS — I82501 Chronic embolism and thrombosis of unspecified deep veins of right lower extremity: Secondary | ICD-10-CM | POA: Diagnosis not present

## 2019-03-29 NOTE — Telephone Encounter (Signed)
Pt called about follow up with vascular, pt given follow up info.

## 2019-04-01 ENCOUNTER — Ambulatory Visit: Payer: Federal, State, Local not specified - PPO | Admitting: Physician Assistant

## 2019-04-01 ENCOUNTER — Other Ambulatory Visit: Payer: Self-pay

## 2019-04-01 VITALS — BP 130/92 | HR 83 | Temp 97.8°F | Resp 16 | Ht 66.0 in | Wt 168.0 lb

## 2019-04-01 DIAGNOSIS — I82431 Acute embolism and thrombosis of right popliteal vein: Secondary | ICD-10-CM | POA: Diagnosis not present

## 2019-04-01 NOTE — Progress Notes (Signed)
VASCULAR [facility] VEIN SPECIALISTS           OF   History and Physical   Sean Conrad is a 63 y.o. (1956-04-08) male who presented to the ED on 03/22/2019 with c/o RLE pain.  He had a venous duplex, which revealed a new acute DVT and he was placed on Xarelto.  He does have hx of DVT in the right leg ~ 15-20 years ago.  He does not recall any injury to his leg or prolonged travel at that time.  He states he was put in the hospital and placed on blood thinner and discharged about 3 days later.    He states that on 8/22, he woke up and had right leg pain.  He states that he felt like if he got up and moved around it would improve, but it didn't.  He went to the urgent care on 8/25 and was found to have a recurrent acute DVT and was placed on Xarelto.  He states that he was working at [employer] in the recent past and was on his knees on the floor at times, but does not recall a specific injury to his leg.  He has not had any recent travel.  He states that he was wearing his knee high compression stockings pretty regularly, but when he quit [place] about 3-4 weeks prior to DVT diagnosis, he had taken a break from wearing his compression.  He states he was wearing them regularly prior to this.  He has been taking Tylenol and has gotten some relief with this.  He has hx of kidney stones and herniated disc on the right.  He states that standing makes it worse.    He does have hx of HIV that is controlled and followed by ID regularly.  The pt is not on a statin for cholesterol management.  The pt is not on a daily aspirin.   Other AC:  Xarelto The pt is not on medication for hypertension.   The pt is not diabetic.   Tobacco hx:  current  Past Medical History:  Diagnosis Date  . Bell's palsy 2015  . Colon polyps   . DVT (deep venous thrombosis) ([place]) 1992   Right calf  . Elevated serum creatinine   . Erectile dysfunction   . HIV infection ([place])   . Hyperlipidemia   . Kidney stones    . Pneumonia     Past Surgical History:  Procedure Laterality Date  . COLONOSCOPY    . INGUINAL HERNIA REPAIR Left   . LITHOTRIPSY      Social History   Socioeconomic History  . Marital status: Single    Spouse name: Not on file  . Number of children: 0  . Years of education: Not on file  . Highest education level: Not on file  Occupational History  . Occupation: [employer] part time  Social Needs  . Financial resource strain: Not on file  . Food insecurity    Worry: Not on file    Inability: Not on file  . Transportation needs    Medical: Not on file    Non-medical: Not on file  Tobacco Use  . Smoking status: Current Every Day Smoker    Packs/day: 0.50    Types: Cigarettes  . Smokeless tobacco: Never Used  . Tobacco comment: trying to quit  Substance and Sexual Activity  . Alcohol use: Yes    Alcohol/week: 2.0 standard drinks  Types: 1 Glasses of wine, 1 Shots of liquor per week    Comment: social  . Drug use: No  . Sexual activity: Yes    Birth control/protection: Condom  Lifestyle  . Physical activity    Days per week: Not on file    Minutes per session: Not on file  . Stress: Not on file  Relationships  . Social Herbalist on phone: Not on file    Gets together: Not on file    Attends religious service: Not on file    Active member of club or organization: Not on file    Attends meetings of clubs or organizations: Not on file    Relationship status: Not on file  . Intimate partner violence    Fear of current or ex partner: Not on file    Emotionally abused: Not on file    Physically abused: Not on file    Forced sexual activity: Not on file  Other Topics Concern  . Not on file  Social History Narrative  . Not on file    Family History  Problem Relation Age of Onset  . Colon cancer Mother        dx in her early 42's  . Colon cancer Father        dx in his 46's  . Colon cancer Paternal Grandmother   . Esophageal cancer Neg Hx   .  Inflammatory bowel disease Neg Hx   . Liver disease Neg Hx   . Pancreatic cancer Neg Hx   . Stomach cancer Neg Hx     Current Outpatient Medications  Medication Sig Dispense Refill  . diclofenac sodium (VOLTAREN) 1 % GEL Apply 4 g topically 4 (four) times daily as needed. 100 g 2  . Krill Oil 1000 MG CAPS Take by mouth.    . Multiple Vitamins-Minerals (CENTRUM SILVER 50+MEN PO) Take by mouth.    Marland Kitchen PREZCOBIX 800-150 MG tablet Take 1 tablet by mouth daily with breakfast. 90 tablet 2  . Rivaroxaban 15 & 20 MG TBPK Take as directed on package: Start with one 15mg  tablet by mouth twice a day with food. On Day 22, switch to one 20mg  tablet once a day with food. 51 each 0  . sildenafil (VIAGRA) 50 MG tablet Take 50-100 mg by mouth See admin instructions. 20 mins prior to 2/24 hrs not to exceed    . TIVICAY 50 MG tablet Take 1 tablet (50 mg total) by mouth daily. 90 tablet 2   No current facility-administered medications for this visit.     Allergies  Allergen Reactions  . Bactrim [Sulfamethoxazole-Trimethoprim] Rash    REVIEW OF SYSTEMS:   [X]  denotes positive finding, [ ]  denotes negative finding Cardiac  Comments:  Chest pain or chest pressure:    Shortness of breath upon exertion:    Short of breath when lying flat:    Irregular heart rhythm:        Vascular    Pain in calf, thigh, or hip brought on by ambulation: x   Pain in feet at night that wakes you up from your sleep:  x   Blood clot in your veins:    Leg swelling:  x       Pulmonary    Oxygen at home:    Productive cough:     Wheezing:         Neurologic    Sudden weakness in arms or legs:  Sudden numbness in arms or legs:     Sudden onset of difficulty speaking or slurred speech:    Temporary loss of vision in one eye:     Problems with dizziness:         Gastrointestinal    Blood in stool:     Vomited blood:         Genitourinary    Burning when urinating:     Blood in urine:        Psychiatric     Major depression:         Hematologic    Bleeding problems:    Problems with blood clotting too easily:        Skin    Rashes or ulcers:        Constitutional    Fever or chills:      PHYSICAL EXAMINATION:  Today's Vitals   04/01/19 0951  BP: (!) 130/92  Pulse: 83  Resp: 16  Temp: 97.8 F (36.6 C)  TempSrc: Temporal  SpO2: 100%  Weight: 168 lb (76.2 kg)  Height: 5\' 6"  (1.676 m)  PainSc: 4    Body mass index is 27.12 kg/m.   General:  WDWN in NAD; vital signs documented above Gait: Not observed HENT: WNL, normocephalic Pulmonary: normal non-labored breathing , without Rales, rhonchi,  wheezing Cardiac: regular HR; without carotid bruits Abdomen: soft, NT, no masses Skin: without rashes Vascular Exam/Pulses:  Right Left  Radial 2+ (normal) 2+ (normal)  DP 2+ (normal) 2+ (normal)  PT Unable to palpate  Unable to palpate    Extremities: without ischemic changes, without Gangrene , without cellulitis; without open wounds; swelling RLE with mild tenderness to palpation right lateral leg below the knee.  Musculoskeletal: no muscle wasting or atrophy  Neurologic: A&O X 3;  No focal weakness or paresthesias are detected Psychiatric:  The pt has Normal affect.   Non-Invasive Vascular Imaging:   Venous duplex on 03/22/2019: Summary: Right: Findings consistent with age indeterminate deep vein thrombosis involving the right popliteal vein, and right posterior tibial veins. Findings consistent with chronic deep vein thrombosis involving the right popliteal vein, right peroneal veins,  and right posterior tibial veins. No cystic structure found in the popliteal fossa. See technical comments and thrombus aging listed above Left: There is no evidence of a common femoral vein obstruction.   Assessment/Plan: Sean Conrad is a 63 y.o. male who presents for follow up to ER visit with hx of acute DVT RLE with hx of DVT ~ 15 years ago.  Pt recently diagnosed with chronic  DVT as well as acute DVT RLE and he was placed on Xarelto starter pack.  He is wearing Tommy Copper compression socks daily.  He is getting pain relief with Tylenol.  I did discuss with him to be cautious of his Tylenol dosing and he expressed he has been decreasing his dose as his pain is improving.   -given he has a recurrent DVT, which possibly could be due to HIV.  Discussed with Dr. Donzetta Matters and feel he would benefit from hematology referral for workup and evaluation for duration of anticoagulation.  The pt is in agreement.    -he will also wear compression stockings.  He will try the 20-84mmHg but if not tolerable, he will get 15-53mmHg.  He is measured here today.  Also encouraged leg elevation for swelling when not ambulating.   -he will f/u with Vascular Surgery as needed.   Leontine Locket, [facility] 04/01/2019 8:44 AM   Clinic MD:  Donzetta Matters

## 2019-04-05 DIAGNOSIS — I82501 Chronic embolism and thrombosis of unspecified deep veins of right lower extremity: Secondary | ICD-10-CM | POA: Diagnosis not present

## 2019-04-05 DIAGNOSIS — I82401 Acute embolism and thrombosis of unspecified deep veins of right lower extremity: Secondary | ICD-10-CM | POA: Diagnosis not present

## 2019-04-07 NOTE — Progress Notes (Signed)
Aldona Bar thanks for the note   Unfortunately there is an interaction with his ART-moderately increased risk of bleeding with the Xarelto and his HIV medication (Prezcobix).  I don't know much about his complete HIV history given he has had care at multiple places but his regimen suggests he has some resistant virus.   Can you tell me how long he may be on the xarelto? I have an idea to switch him to a twice daily injectable  HIV medication and keep one of his pills for a compete treatment option but people don't typically like that for a long duration. I may have one more option I would be comfortable with but in framing these I wanted to get an idea of his projected anticoagulation options to continue safely treating both conditions.    Thanks again for letting me know of the new med, Janene Madeira, NP

## 2019-04-08 ENCOUNTER — Telehealth: Payer: Self-pay | Admitting: Infectious Diseases

## 2019-04-08 MED ORDER — BIKTARVY 50-200-25 MG PO TABS: 1.0000 | ORAL_TABLET | Freq: Every day | ORAL | 5 refills | Status: DC

## 2019-04-08 NOTE — Telephone Encounter (Signed)
Calling in regards to identified drug interactions with Xarelto and Prezcobix. I will have him stop his Prezcobix and Tivicay and change him to [facility] and have him follow up in ID clinic in 2 months to check a viral load.   Will have him come to office for follow up as scheduled in October and repeat viral load for him then.   Will send Rx to specialty pharmacy and alert our ID pharmacy team about the switch incase we need assistance for cost.   I answered all questions and welcomed a call back should he have questions/concerns.

## 2019-04-09 DIAGNOSIS — Z23 Encounter for immunization: Secondary | ICD-10-CM | POA: Diagnosis not present

## 2019-04-11 NOTE — Telephone Encounter (Addendum)
[facility] Patient Advocate Encounter   Was successful in obtaining a [place] copay card for [employer]. This copay card will make the patients copay $0.  I have spoken with the patient and he fills at [facility] and confirmed shipment this morning with the company to have the medication shipped at the $90.00 copay price. I provided the representative on the phone copay information but concerned they will still bill the patient.   The billing information is as follows: RxBin: [id number] PCN: ACCESS Member ID: [id number] Group ID: [id number]  Bartholomew Crews, [place] Patient [facility] for Infectious Disease Phone: [phone] Fax: [phone] 04/11/2019 9:41 AM

## 2019-04-20 ENCOUNTER — Telehealth: Payer: Self-pay | Admitting: Nurse Practitioner

## 2019-04-20 NOTE — Telephone Encounter (Signed)
A new hem appt has been scheduled to see Sean Conrad on 10/12 at 1:45pm. Pt is aware to arrive 15 minutes early.

## 2019-04-25 ENCOUNTER — Other Ambulatory Visit: Payer: Federal, State, Local not specified - PPO

## 2019-05-09 ENCOUNTER — Encounter: Payer: Self-pay | Admitting: Infectious Diseases

## 2019-05-09 ENCOUNTER — Telehealth: Payer: Self-pay | Admitting: Nurse Practitioner

## 2019-05-09 ENCOUNTER — Other Ambulatory Visit: Payer: Self-pay

## 2019-05-09 ENCOUNTER — Inpatient Hospital Stay: Payer: Federal, State, Local not specified - PPO | Attending: Nurse Practitioner | Admitting: Nurse Practitioner

## 2019-05-09 ENCOUNTER — Ambulatory Visit: Payer: Federal, State, Local not specified - PPO | Admitting: Infectious Diseases

## 2019-05-09 VITALS — BP 140/95 | HR 79 | Temp 98.5°F | Resp 17 | Ht 66.0 in | Wt 177.8 lb

## 2019-05-09 VITALS — BP 133/86 | HR 77 | Temp 98.0°F

## 2019-05-09 DIAGNOSIS — F17210 Nicotine dependence, cigarettes, uncomplicated: Secondary | ICD-10-CM | POA: Insufficient documentation

## 2019-05-09 DIAGNOSIS — I82431 Acute embolism and thrombosis of right popliteal vein: Secondary | ICD-10-CM | POA: Diagnosis not present

## 2019-05-09 DIAGNOSIS — I82551 Chronic embolism and thrombosis of right peroneal vein: Secondary | ICD-10-CM | POA: Diagnosis not present

## 2019-05-09 DIAGNOSIS — Z8042 Family history of malignant neoplasm of prostate: Secondary | ICD-10-CM | POA: Diagnosis not present

## 2019-05-09 DIAGNOSIS — I82441 Acute embolism and thrombosis of right tibial vein: Secondary | ICD-10-CM

## 2019-05-09 DIAGNOSIS — I82461 Acute embolism and thrombosis of right calf muscular vein: Secondary | ICD-10-CM

## 2019-05-09 DIAGNOSIS — Z7901 Long term (current) use of anticoagulants: Secondary | ICD-10-CM | POA: Diagnosis not present

## 2019-05-09 DIAGNOSIS — Z21 Asymptomatic human immunodeficiency virus [HIV] infection status: Secondary | ICD-10-CM

## 2019-05-09 DIAGNOSIS — Z86718 Personal history of other venous thrombosis and embolism: Secondary | ICD-10-CM | POA: Insufficient documentation

## 2019-05-09 DIAGNOSIS — I82401 Acute embolism and thrombosis of unspecified deep veins of right lower extremity: Secondary | ICD-10-CM

## 2019-05-09 DIAGNOSIS — I82531 Chronic embolism and thrombosis of right popliteal vein: Secondary | ICD-10-CM | POA: Diagnosis not present

## 2019-05-09 DIAGNOSIS — Z0000 Encounter for general adult medical examination without abnormal findings: Secondary | ICD-10-CM

## 2019-05-09 DIAGNOSIS — Z800 Family history of malignant neoplasm of digestive organs: Secondary | ICD-10-CM

## 2019-05-09 MED ORDER — BIKTARVY 50-200-25 MG PO TABS: 1.0000 | ORAL_TABLET | Freq: Every day | ORAL | 2 refills | Status: DC

## 2019-05-09 NOTE — Progress Notes (Signed)
Name: Sean Conrad  DOB: 12-02-1955 MRN: [id number] PCP: Renaldo Fiddler ([place])  Patient Active Problem List   Diagnosis Date Noted  . Acute deep vein thrombosis (DVT) of calf muscle vein of right lower extremity ([place]) 05/09/2019  . High risk for colon cancer 08/10/2018  . Abnormal liver function test 08/10/2018  . Family history of colon cancer 06/08/2018  . History of colonic polyps 06/08/2018  . Strain of lumbar region 04/15/2018  . Erectile dysfunction 10/06/2017  . Healthcare maintenance 06/08/2017  . HIV (human immunodeficiency virus infection) ([place]) 04/23/2017  . Creatinine elevation 04/23/2017  . Tobacco abuse 04/23/2017    Subjective:  Brief ID:  Sean Conrad transferred care in August 2018 from [place] area (Dr. Ramond Dial MD). Retired since 2017. Dx Feb 1995 and   Previous Medications:   Truvada, AZT, Stribild and others he cannot recall.   Genotype:   "many" per his report. None were sent with transfer documentation.   Chief Complaint  Patient presents with  . Follow-up    B20    HPI/ROS:  Since his last office visit with me he was diagnosed with acute R leg DVT and started on Xarelto for treatment. We had to switch his HIV regimen from Tivicay + Prezcobix to [place] due to a drug interaction with Xarelto. So far he has tolerated this switch well and has had no concerns for side effects. Aside from that he has no other additions or changes to his medical history today. He received his flu shot already with PCP. Gave up his part time job with [employer] due to too many COVID-19 outbreaks on staff. He has since been staying in the home and keeping to himself. Has picked up a few pounds. No new sexual partners/encounters since our last office visit together. He is eating and sleeping well. No concerns with depression or anxiety. Continues to smoke a few cigarettes a day; quarantine has made it very difficult to completely quit.    Review of  Systems  Constitutional: Negative for chills, fever, malaise/fatigue and weight loss.  HENT: Negative for sore throat.        No dental problems  Respiratory: Negative for cough and sputum production.   Cardiovascular: Negative for chest pain and leg swelling.  Gastrointestinal: Negative for abdominal pain, diarrhea and vomiting.  Genitourinary: Negative for dysuria and flank pain.  Musculoskeletal: Negative for joint pain, myalgias and neck pain.  Skin: Negative for rash.  Neurological: Negative for dizziness, tingling and headaches.  Psychiatric/Behavioral: Negative for depression and substance abuse. The patient is not nervous/anxious and does not have insomnia.    Past Medical History:  Diagnosis Date  . Bell's palsy 2015  . Colon polyps   . DVT (deep venous thrombosis) ([place]) 1992   Right calf  . Elevated serum creatinine   . Erectile dysfunction   . HIV infection ([place])   . Hyperlipidemia   . Kidney stones   . Pneumonia     Social History   Tobacco Use  . Smoking status: Current Every Day Smoker    Packs/day: 0.50    Types: Cigarettes  . Smokeless tobacco: Never Used  . Tobacco comment: trying to quit  Substance Use Topics  . Alcohol use: Yes    Alcohol/week: 2.0 standard drinks    Types: 1 Glasses of wine, 1 Shots of liquor per week    Comment: social  . Drug use: No    Family History  Problem Relation Age of Onset  .  Colon cancer Mother        dx in her early 41's  . Colon cancer Father        dx in his 52's  . Colon cancer Paternal Grandmother   . Esophageal cancer Neg Hx   . Inflammatory bowel disease Neg Hx   . Liver disease Neg Hx   . Pancreatic cancer Neg Hx   . Stomach cancer Neg Hx     Allergies  Allergen Reactions  . Bactrim [Sulfamethoxazole-Trimethoprim] Rash    OBJECTIVE: Vitals:   05/09/19 0926  BP: 133/86  Pulse: 77  Temp: 98 F (36.7 C)   There is no height or weight on file to calculate BMI.  Physical Exam  Constitutional:  He is oriented to person, place, and time and well-developed, well-nourished, and in no distress.  HENT:  Mouth/Throat: No oral lesions. Normal dentition. No dental caries.  Eyes: No scleral icterus.  Cardiovascular: Normal rate, regular rhythm and normal heart sounds.  Pulmonary/Chest: Effort normal and breath sounds normal.  Abdominal: Soft. He exhibits no distension. There is no abdominal tenderness.  Lymphadenopathy:    He has no cervical adenopathy.  Neurological: He is alert and oriented to person, place, and time.  Skin: Skin is warm and dry. No rash noted.  Psychiatric: Mood and affect normal.    Lab Results Lab Results  Component Value Date   WBC 7.4 09/30/2018   HGB 14.3 09/30/2018   HCT 40.8 09/30/2018   MCV 94.7 09/30/2018   PLT 207 09/30/2018    Lab Results  Component Value Date   CREATININE 1.05 09/30/2018   BUN 16 09/30/2018   NA 139 09/30/2018   K 4.7 09/30/2018   CL 104 09/30/2018   CO2 30 09/30/2018    Lab Results  Component Value Date   ALT 20 09/30/2018   AST 27 09/30/2018   ALKPHOS 84 08/03/2018   BILITOT 0.5 09/30/2018    Lab Results  Component Value Date   CHOL 193 09/30/2018   HDL 63 09/30/2018   LDLCALC 114 (H) 09/30/2018   TRIG 68 09/30/2018   CHOLHDL 3.1 09/30/2018   Lab Results  Component Value Date   HEPBSAG NON-REACTIVE 04/09/2017   HEPBSAB REACTIVE (A) 04/09/2017   No results found for: HCVAB Lab Results  Component Value Date   CHLAMYDIAWP Negative 04/09/2017   N Negative 04/09/2017     Problem List Items Addressed This Visit      Unprioritized   HIV (human immunodeficiency virus infection) ([place]) - Primary (Chronic)    He has previously been under very good control with his regimen Tivicay + Prezcobix. We had to switch him off of his cobicistat containing regimen as it has a major contraindication with Xarelto. I switched him to [place] once a day in lieu of this. I do, however have some anxiety over his long standing HIV  disease and no previous genotypes that were ever sent from his previous provider. Will repeat a viral load today and again in 3 months.   If it looks as if he has rebound viremia would consider adding Pifeltro or if Hematology would consider Apixaban 2.5 mg dose we can place him safely back on his previous HIV regimen.       Relevant Medications   bictegravir-emtricitabine-tenofovir AF (BIKTARVY) 50-200-25 MG TABS tablet   Other Relevant Orders   HIV-1 RNA quant-no reflex-bld   T-helper cell (CD4)- ([facility] clinic only)   HIV-1 RNA quant-no reflex-bld   Healthcare maintenance  Vaccines up to date. He received his flu shot already. PPSV23 final dose due at 63 yo. He should consider Shingrix vaccine series as well.       Acute deep vein thrombosis (DVT) of calf muscle vein of right lower extremity [facility])    He has an appointment with Hematology today. If it looks as if he may need lifelong AC would either ask hematology to consider swap to Eliquis 2.5 mg so we may put him back on his Prezcobix + Tivicay (which I would feel more comfortable with not knowing his genotype details).       Relevant Medications   XARELTO 20 MG TABS tablet     Janene Madeira, MSN, NP-C [facility] for Infectious [place] Pager: [phone]  05/09/19 1:01 PM

## 2019-05-09 NOTE — Assessment & Plan Note (Signed)
Vaccines up to date. He received his flu shot already. PPSV23 final dose due at 63 yo. He should consider Shingrix vaccine series as well.

## 2019-05-09 NOTE — Assessment & Plan Note (Signed)
He has previously been under very good control with his regimen Tivicay + Prezcobix. We had to switch him off of his cobicistat containing regimen as it has a major contraindication with Xarelto. I switched him to [place] once a day in lieu of this. I do, however have some anxiety over his long standing HIV disease and no previous genotypes that were ever sent from his previous provider. Will repeat a viral load today and again in 3 months.   If it looks as if he has rebound viremia would consider adding Pifeltro or if Hematology would consider Apixaban 2.5 mg dose we can place him safely back on his previous HIV regimen.

## 2019-05-09 NOTE — Telephone Encounter (Signed)
Per 10/12 los F/u as needed

## 2019-05-09 NOTE — Progress Notes (Addendum)
[facility]  [phone] Fax:([phone]) [place] consult Note   Patient Care Team: Tisovec, Fransico Him, MD as PCP - General (Internal Medicine) [place] Callas, NP as Nurse Practitioner (Infectious Diseases)  Date of Service: 05/09/2019   CHIEF COMPLAINTS/PURPOSE OF CONSULTATION:  Recurrent DVT; Referred by Leontine Locket, PA-C (Vascular and vein specialist)  HISTORY OF PRESENTING ILLNESS:  Sean Conrad 63 y.o. male with history of HIV is here because of recurrent DVT. First episode in the right leg was 15-20 years ago; he does not recall any injury, prolonged travel, or provoking events. 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 He presented to the ED on 03/22/19 with 2 day history of intermittent right leg pain. He was not on anticoagulation.  Venous doppler showed findings consistent with age indeterminate DVT involving the right popliteal and right posterior tibialis veins. 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 He was prescribed Xarelto starter pack and instructed to f/u with vascular surgery who he say on 04/01/19.   PMH is significant for HIV followed by ID Dr. Doren Custard. He was previously well controlled on Ticivay + Prezcobix then changed to [facility] due to drug interaction with Prezcobix and Xarelto. Socially, he is single, no children. He lives alone and is independent of ADLs. He drinks alcohol socially. Tobacco use entails 1/2 PPD x30 years. He has tried to quit but quarantine makes it difficult. He reports being UTD on cancer screenings. Denies family history of DVT or blood disorder. He has strong family history of colon cancer in mother, father, maternal aunt, paternal aunt and prostate cancer in paternal uncle. His last colonoscopy was 08/2018 with 5 year  recall. His PCP checked PSA this year but he does not know the number.   Today he presents to clinic alone. He feels well. He tolerates xarelto without side effects. Denies GI upset or bleeding. Denies new calf pain. Right leg is always larger. He has gained weight since he stopped working and is eating more at home. He is healthy, walks and works outside. 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 Denies recent cough, chest pain, dyspnea.   MEDICAL HISTORY:  Past Medical History:  Diagnosis Date  . Bell's palsy 2015  . Colon polyps   . DVT (deep venous thrombosis) ([place]) 1992   Right calf  . Elevated serum creatinine   . Erectile dysfunction   . HIV infection ([place])   . Hyperlipidemia   . Kidney stones   . Pneumonia     SURGICAL HISTORY: Past Surgical History:  Procedure Laterality Date  . COLONOSCOPY    . INGUINAL HERNIA REPAIR Left   . LITHOTRIPSY      SOCIAL HISTORY: Social History   Socioeconomic History  . Marital status: Single    Spouse name: Not on file  . Number of children: 0  . Years of education: Not on file  . Highest education level: Not on file  Occupational History  . Occupation: [employer] part time  Social Needs  . Financial resource strain: Not on file  . Food insecurity    Worry: Not on file    Inability: Not on file  . Transportation needs    Medical: Not on file    Non-medical: Not on file  Tobacco Use  . Smoking  status: Current Every Day Smoker    Packs/day: 0.50    Types: Cigarettes  . Smokeless tobacco: Never Used  . Tobacco comment: trying to quit  Substance and Sexual Activity  . Alcohol use: Yes    Alcohol/week: 2.0 standard drinks    Types: 1 Glasses of wine, 1 Shots of liquor per week    Comment: social  . Drug use: No  . Sexual activity: Yes    Birth control/protection: Condom  Lifestyle  . Physical activity    Days per week: Not on file    Minutes per session: Not on file  . Stress: Not on file  Relationships  . Social  Herbalist on phone: Not on file    Gets together: Not on file    Attends religious service: Not on file    Active member of club or organization: Not on file    Attends meetings of clubs or organizations: Not on file    Relationship status: Not on file  . Intimate partner violence    Fear of current or ex partner: Not on file    Emotionally abused: Not on file    Physically abused: Not on file    Forced sexual activity: Not on file  Other Topics Concern  . Not on file  Social History Narrative  . Not on file    FAMILY HISTORY: Family History  Problem Relation Age of Onset  . Colon cancer Mother        dx in her early 12's  . Colon cancer Father        dx in his 74's  . Colon cancer Paternal Grandmother   . Esophageal cancer Neg Hx   . Inflammatory bowel disease Neg Hx   . Liver disease Neg Hx   . Pancreatic cancer Neg Hx   . Stomach cancer Neg Hx     ALLERGIES:  is allergic to bactrim [sulfamethoxazole-trimethoprim].  MEDICATIONS:  Current Outpatient Medications  Medication Sig Dispense Refill  . Cholecalciferol (VITAMIN D) 125 MCG (5000 UT) CAPS Take 1 tablet by mouth daily.    . bictegravir-emtricitabine-tenofovir AF (BIKTARVY) 50-200-25 MG TABS tablet Take 1 tablet by mouth daily. 90 tablet 2  . diclofenac sodium (VOLTAREN) 1 % GEL Apply 4 g topically 4 (four) times daily as needed. 100 g 2  . Krill Oil 1000 MG CAPS Take by mouth.    . Multiple Vitamins-Minerals (CENTRUM SILVER 50+MEN PO) Take by mouth.    . sildenafil (VIAGRA) 50 MG tablet Take 50-100 mg by mouth See admin instructions. 20 mins prior to 2/24 hrs not to exceed    . XARELTO 20 MG TABS tablet Take 20 mg by mouth daily.     No current facility-administered medications for this visit.     PHYSICAL EXAMINATION: ECOG PERFORMANCE STATUS: 0 - Asymptomatic  Vitals:   05/09/19 1418  BP: (!) 140/95  Pulse: 79  Resp: 17  Temp: 98.5 F (36.9 C)  SpO2: 99%   Filed Weights   05/09/19 1418   Weight: 177 lb 12.8 oz (80.6 kg)    GENERAL:alert, no distress and comfortable SKIN: no rash  EYES: sclera clear LUNGS: clear with normal breathing effort HEART: regular rate & rhythm; right leg larger than left. Wearing compression stockings  ABDOMEN:abdomen soft, non-tender and normal bowel sounds Musculoskeletal:no cyanosis of digits PSYCH: alert & oriented x 3 with fluent speech NEURO: no focal motor/sensory deficits  LABORATORY DATA:  I have reviewed the data  as listed CBC Latest Ref Rng & Units 09/30/2018 09/26/2018 09/22/2017  WBC 3.8 - 10.8 Thousand/uL 7.4 8.4 7.5  Hemoglobin 13.2 - 17.1 g/dL 14.3 14.7 15.9  Hematocrit 38.5 - 50.0 % 40.8 45.0 47.7  Platelets 140 - 400 Thousand/uL 207 191 193    CMP Latest Ref Rng & Units 09/30/2018 09/26/2018 08/03/2018  Glucose 65 - 99 mg/dL 97 99 84  BUN 7 - 25 mg/dL 16 15 14   Creatinine 0.70 - 1.25 mg/dL 1.05 1.27(H) 1.04  Sodium 135 - 146 mmol/L 139 137 137  Potassium 3.5 - 5.3 mmol/L 4.7 4.2 4.4  Chloride 98 - 110 mmol/L 104 104 105  CO2 20 - 32 mmol/L 30 25 27   Calcium 8.6 - 10.3 mg/dL 9.8 9.5 9.7  Total Protein 6.1 - 8.1 g/dL 6.8 - 7.1  Total Bilirubin 0.2 - 1.2 mg/dL 0.5 - 0.4  Alkaline Phos 39 - 117 U/L - - 84  AST 10 - 35 U/L 27 - 26  ALT 9 - 46 U/L 20 - 16     RADIOGRAPHIC STUDIES: I have personally reviewed the radiological images as listed and agreed with the findings in the report. No results found.  ASSESSMENT & PLAN: 63 yo male with HIV and recurrent DVT  1. Recurrent unprovoked RLE DVT - We reviewed his medical record in detail. First DVT episode was 20 years ago, he completed short term coumadin. He has recurrent unprovoked right lower extremity DVT in 02/2019.  -Mr. Picchi believes he underwent hypercoagulable work up per PCP, I have requested the report  -Due to the unprovoked, recurrent nature of his DVT, we recommend indefinite anticoagulation -He has been treated with Xarelto which he tolerates well. No bleeding.   -Due to drug interaction (increased bleeding) between Prezcobix and Xarelto, his ID provider prefers Eliquis which is a reasonable option.  -Standard dosing would be 5 mg BID, but given that prezcobix is strong CYP3A4 inhibitor, literature suggests to decrease apixaban dosage by 50% for patients receiving doses greater than 2.5 mg orally twice daily.  -he will take 2.5 mg BID; I reviewed with pharmacy and his ID team -When ID provider changes him back to previous HIV regimen from [facility], he will discontinue Xarelto and start Eliquis.  -We reviewed potential side effects including but not limited to bleeding, hematoma, skin rash, transaminitis; he agrees to proceed -We discussed he will need to temporarily hold anticoagulation for invasive procedure, then resume post-op as surgery itself is a risk for thrombosis, patient understands -We also reviewed risk reducing behaviors such as ambulation during prolonged travel, exercise, wearing compression stockings, hydration, and smoking cessation. He has had hard time quitting during the pandemic.  -he will f/u with [place] in the future if needed, as long as he continues routine f/u with PCP, ID, and vascular surgery as indicated   2. HIV -Followed by ID, Janene Madeira, NP  -Well controlled on Tivicay +Prezcobix; due to interaction with prezcobix and Xarelto, he was recently changed to [place] -ID provider prefers tivicay + prezcobix regimen which she would be able to safely administer upon changing him to eliquis  3. Family history, health maintenance -Strong family history of colon cancer in first degree relatives and prostate cancer -He is followed by GI Dr. Rush Landmark, had colonoscopy in 08/2018; no biopsies were taken -He reports PSA also up to date per PCP Dr. Osborne Casco  PLAN: -Medical record reviewed -OK to change from Xarelto 20 mg BID to Eliquis 2.5 mg BID, dosing reviewed  with pharmacy and ID  -Continue f/u with PCP, ID, vascular  -F/u open   All questions were answered. The patient knows to call the clinic with any problems, questions or concerns.     Alla Feeling, NP 05/10/19 8:05 AM   Addendum  I have seen the patient, examined him. I agree with the assessment and and plan and have edited the notes.   Mr. Nybo had two episodes of unprovoked DVT, so lifelong anticoagulation is recommended due to high risk of recurrent thrombosis.  Due to drug interaction with his HIV medications, Eliquis 2.5 mg twice daily is relatively safer and will be recommended. We discussed the risk of anticoagulation, and management around invasive procedures and surgery.  We also discussed thrombosis risk reduction with lifestyle modification.  We have communicated this with ID. We will see him as needed in future.  Truitt Merle  05/09/2019

## 2019-05-09 NOTE — Assessment & Plan Note (Signed)
He has an appointment with Hematology today. If it looks as if he may need lifelong AC would either ask hematology to consider swap to Eliquis 2.5 mg so we may put him back on his Prezcobix + Tivicay (which I would feel more comfortable with not knowing his genotype details).

## 2019-05-09 NOTE — Patient Instructions (Signed)
Nice to see you today!   Regarding the timing of your Biktarvy and your vitamins - If you take any multivitamins or supplements please separate them from your Biktarvy by 6 hours after.  The main thing is do not have them in the stomach at the same time.  Also please avoid any Tums/Rolaids 2 hours before and 6 hours after your Biktarvy.    Please stop by the lab today - I will let you know your results on MyChart portal.   Please come back in 3 months for a lab only visit.   OK to come back to see Colletta [place] again in 6 months

## 2019-05-10 ENCOUNTER — Encounter: Payer: Self-pay | Admitting: Nurse Practitioner

## 2019-05-10 LAB — T-HELPER CELL (CD4) - (RCID CLINIC ONLY)
CD4 % Helper T Cell: 25 % — ABNORMAL LOW (ref 33–65)
CD4 T Cell Abs: 1061 /uL (ref 400–1790)

## 2019-05-11 ENCOUNTER — Telehealth: Payer: Self-pay

## 2019-05-11 LAB — HIV-1 RNA QUANT-NO REFLEX-BLD
HIV 1 RNA Quant: <20 copies/mL
HIV-1 RNA Quant, Log: <1.3 Log copies/mL

## 2019-05-11 NOTE — Telephone Encounter (Signed)
TC per Cira Rue NP toDr. Loren Racer office [phone]) to get them to fax over his hypercoagulable panel to [place] at [phone]). Currently waiting for fax.

## 2019-05-12 ENCOUNTER — Encounter: Payer: Self-pay | Admitting: Nurse Practitioner

## 2019-05-12 MED ORDER — APIXABAN 2.5 MG PO TABS: 2.5000 mg | ORAL_TABLET | Freq: Two times a day (BID) | ORAL | 1 refills | Status: DC

## 2019-05-13 ENCOUNTER — Telehealth: Payer: Self-pay | Admitting: Pharmacy Technician

## 2019-05-13 ENCOUNTER — Telehealth: Payer: Self-pay | Admitting: Pharmacist

## 2019-05-13 NOTE — Telephone Encounter (Signed)
Spoke to Sean Conrad today regarding his HIV medication regimen and DOAC for his DVT.  He is prescribed Sean Conrad and was taken off of Sean Conrad due to drug-drug interaction.  Sean Conrad and Sean Conrad are having a hard time getting information/records from his previous provider but Sean Conrad states that he knows he has resistance. He was put on Sean Conrad alone but Sean Conrad would like to add Sean Conrad back to regimen (in addition to Sean Conrad) and switch to reduced dose Sean Conrad after taking to Sean Conrad.  Spoke to Sean Conrad and he is on board with plan.  He will go pick up Sean Conrad at Sean Conrad and start taking that soon.  He will not start the Sean Conrad until he is off of the Sean Conrad. He has a 90 day supply of Sean Conrad in the mail on the way to his house and already has 2 full bottles of Sean Conrad on hand as he used to get a 90 day supply.  Encouraged Sean Conrad to call with any issues or questions.   Also, Sean Conrad states that he pays $90 for his HIV medications. He has Sean Conrad, Sean Conrad, so I asked Sean Conrad to look into copay assistance for him.

## 2019-05-13 NOTE — Telephone Encounter (Addendum)
[facility] Patient Advocate Encounter   Was successful in obtaining a [place] copay card for Sean Conrad.   I have spoken with the patient and emailed him a copy of his copay card.    The billing information is as follows: RxBin: [id number] PCN: [id number] Member ID: [id number] Group ID: [id number]  He normally fills at [facility] but this script is being filled at [facility] division. When I called they had no record of the patient but according to the [facility] system, it was filled 05/09/2019 [facility] and next refill not available until 11/04  Sean Conrad, [place] Patient [facility]ious [facility] Phone: [phone] Fax: [phone] 05/13/2019 11:01 AM

## 2019-05-16 MED ORDER — TIVICAY 50 MG PO TABS: 50.0000 mg | ORAL_TABLET | Freq: Every day | ORAL | 11 refills | Status: DC

## 2019-05-16 MED ORDER — PREZCOBIX 800-150 MG PO TABS: 1.0000 | ORAL_TABLET | Freq: Every day | ORAL | 11 refills | Status: DC

## 2019-05-16 NOTE — Telephone Encounter (Signed)
[facility] Patient Advocate Encounter  Called patient back after speaking to [facility] and let him know the coupon would not work on 90-day refills. The current one was already out for shipment. Moving forward with his January refill, he was interested in applying for PAF to cover both his medication copays. When [facility] they had him listed as living in [place] but he had that resolved.   For his local [facility] refill of Eliquis, he was going to print off a $10 per month coupon from the manufacturer. He did not need any other assistance at this time with his medications but will follow back up before his January shipments.

## 2019-05-16 NOTE — Telephone Encounter (Signed)
Thank you Adonis Brook!

## 2019-05-16 NOTE — Telephone Encounter (Signed)
Thank you both.   I went ahead and put in order for Tivicay to start with monthly fills in January 2021.  Prezcobix as well has been entered for 30 day fill. This will start once he switches from Xarelto to Eliquis.    Janene Madeira, MSN, NP-C [facility] for Infectious Disease [email or web address] Pager: [phone] Office: [phone] [place]: [phone]

## 2019-05-16 NOTE — Addendum Note (Signed)
Addended by: [place] Callas on: 05/16/2019 10:46 AM   Modules accepted: Orders

## 2019-06-13 DIAGNOSIS — E7800 Pure hypercholesterolemia, unspecified: Secondary | ICD-10-CM | POA: Diagnosis not present

## 2019-06-13 DIAGNOSIS — Z0000 Encounter for general adult medical examination without abnormal findings: Secondary | ICD-10-CM | POA: Diagnosis not present

## 2019-06-13 DIAGNOSIS — Z125 Encounter for screening for malignant neoplasm of prostate: Secondary | ICD-10-CM | POA: Diagnosis not present

## 2019-06-16 DIAGNOSIS — R82998 Other abnormal findings in urine: Secondary | ICD-10-CM | POA: Diagnosis not present

## 2019-06-20 DIAGNOSIS — E7800 Pure hypercholesterolemia, unspecified: Secondary | ICD-10-CM | POA: Diagnosis not present

## 2019-06-20 DIAGNOSIS — Z1331 Encounter for screening for depression: Secondary | ICD-10-CM | POA: Diagnosis not present

## 2019-06-20 DIAGNOSIS — F17200 Nicotine dependence, unspecified, uncomplicated: Secondary | ICD-10-CM | POA: Diagnosis not present

## 2019-06-20 DIAGNOSIS — Z0000 Encounter for general adult medical examination without abnormal findings: Secondary | ICD-10-CM | POA: Diagnosis not present

## 2019-06-20 DIAGNOSIS — B20 Human immunodeficiency virus [HIV] disease: Secondary | ICD-10-CM | POA: Diagnosis not present

## 2019-06-20 DIAGNOSIS — I82501 Chronic embolism and thrombosis of unspecified deep veins of right lower extremity: Secondary | ICD-10-CM | POA: Diagnosis not present

## 2019-07-13 DIAGNOSIS — Z1212 Encounter for screening for malignant neoplasm of rectum: Secondary | ICD-10-CM | POA: Diagnosis not present

## 2019-08-09 ENCOUNTER — Other Ambulatory Visit: Payer: Federal, State, Local not specified - PPO

## 2019-08-09 ENCOUNTER — Other Ambulatory Visit: Payer: Self-pay

## 2019-08-09 DIAGNOSIS — Z21 Asymptomatic human immunodeficiency virus [HIV] infection status: Secondary | ICD-10-CM

## 2019-08-13 LAB — HIV-1 RNA QUANT-NO REFLEX-BLD
HIV 1 RNA Quant: <20 copies/mL — AB
HIV-1 RNA Quant, Log: <1.3 Log copies/mL — AB

## 2019-11-04 ENCOUNTER — Telehealth: Payer: Self-pay

## 2019-11-04 NOTE — Telephone Encounter (Signed)
COVID-19 Pre-Screening Questions:11/04/19  Do you currently have a fever (>100 F), chills or unexplained body aches?NO   Are you currently experiencing new cough, shortness of breath, sore throat, runny nose?NO  .  Have you recently travelled outside the state of [place] in the last 14 days? NO  .  Have you been in contact with someone that is currently pending confirmation of Covid19 testing or has been confirmed to have the Covid19 virus? NO  **If the patient answers NO to ALL questions -  advise the patient to please call the clinic before coming to the office should any symptoms develop.     

## 2019-11-07 ENCOUNTER — Other Ambulatory Visit: Payer: Self-pay

## 2019-11-07 ENCOUNTER — Encounter: Payer: Self-pay | Admitting: Infectious Diseases

## 2019-11-07 ENCOUNTER — Ambulatory Visit: Payer: Federal, State, Local not specified - PPO | Admitting: Infectious Diseases

## 2019-11-07 VITALS — BP 132/86 | HR 69 | Temp 97.3°F | Wt 182.0 lb

## 2019-11-07 DIAGNOSIS — Z21 Asymptomatic human immunodeficiency virus [HIV] infection status: Secondary | ICD-10-CM | POA: Diagnosis not present

## 2019-11-07 DIAGNOSIS — R7989 Other specified abnormal findings of blood chemistry: Secondary | ICD-10-CM

## 2019-11-07 DIAGNOSIS — Z86718 Personal history of other venous thrombosis and embolism: Secondary | ICD-10-CM | POA: Diagnosis not present

## 2019-11-07 DIAGNOSIS — Z0000 Encounter for general adult medical examination without abnormal findings: Secondary | ICD-10-CM | POA: Diagnosis not present

## 2019-11-07 MED ORDER — TIVICAY 50 MG PO TABS: 50.0000 mg | ORAL_TABLET | Freq: Every day | ORAL | 11 refills | Status: DC

## 2019-11-07 MED ORDER — PREZCOBIX 800-150 MG PO TABS: 1.0000 | ORAL_TABLET | Freq: Every day | ORAL | 11 refills | Status: DC

## 2019-11-07 NOTE — Assessment & Plan Note (Signed)
Received 1st dose of Moderna COVID vaccine with 2nd dose coming up soon. Also discussed that he would be a good candidate for Shingrix vaccine with a history of shingles in the past. He did receive Zostavax in the past but even with this he would still be recommended to get th 2-dose Shingrix. Recommended to receive 1 month after he completes COVID vaccine.

## 2019-11-07 NOTE — Assessment & Plan Note (Signed)
On anticoagulation with low dose Apixaban (with coadministration of cobicistat). Doing well. Still has some moments where that part of the leg will kind of "fall asleep" almost or get a little tingly. He wears compression stockings and tries to remain in positions that do not compromise blood flow.

## 2019-11-07 NOTE — Patient Instructions (Signed)
Always nice to see you!  I sent in refills for your Prezcobix and Tivicay.   Please stop by the lab on your way out.   Shingrix (Shingles) vaccine would be recommended - can get at the end of May 1 month after your COVID vaccine.   Stay away from grapefruits  :)  Please return in 6 months - can do all labs at the visit.

## 2019-11-07 NOTE — Progress Notes (Signed)
Name: Sean Conrad  DOB: 1956/06/22 MRN: [id number] PCP: Renaldo Fiddler ([place])   Patient Active Problem List   Diagnosis Date Noted  . History of DVT (deep vein thrombosis) 05/09/2019  . High risk for colon cancer 08/10/2018  . Abnormal liver function test 08/10/2018  . History of colonic polyps 06/08/2018  . Erectile dysfunction 10/06/2017  . Healthcare maintenance 06/08/2017  . HIV (human immunodeficiency virus infection) ([place]) 04/23/2017  . Creatinine elevation 04/23/2017  . Tobacco abuse 04/23/2017    Subjective:  Brief ID:  [name] transferred care in August 2018 from [place] area (Dr. Ramond Dial MD). Retired since 2017. Dx Feb 1995 with history of Shingles   Previous Medications:   Truvada, AZT, Stribild and others he cannot recall  Prezcobix + Tivicay --> suppressed   Genotype:   "many" per his report. None were sent with transfer documentation.     Chief Complaint  Patient presents with  . Follow-up     HPI/ROS:  [name] has been doing well since our last visit. He has received his first COVID shot and had no side effects whatsoever; preparing for 2nd dose soon. He has continued his Tivicay and Prezcobix everyday without any concern for side effect; he does need refills today. He has not missed any doses and taking it correctly. No new medications or supplements.   Has noticed some increased flatus and belly bloating at times he attributed to increased fruit intake. He has also started on a natural fiber replacement around the same time. This has resolved since reducing intake. He would like to start a walking program soon; has noticed more aches in legs of late.     Review of Systems  Constitutional: Negative for chills, fever, malaise/fatigue and weight loss.  HENT: Negative for sore throat.        No dental problems  Respiratory: Negative for cough and sputum production.   Cardiovascular: Negative for chest pain and leg  swelling.  Gastrointestinal: Negative for abdominal pain, diarrhea and vomiting.  Genitourinary: Negative for dysuria and flank pain.  Musculoskeletal: Negative for joint pain, myalgias and neck pain.  Skin: Negative for rash.  Neurological: Negative for dizziness, tingling and headaches.  Psychiatric/Behavioral: Negative for depression and substance abuse. The patient is not nervous/anxious and does not have insomnia.    Past Medical History:  Diagnosis Date  . Bell's palsy 2015  . Colon polyps   . DVT (deep venous thrombosis) ([place]) 1992   Right calf  . Elevated serum creatinine   . Erectile dysfunction   . HIV infection ([place])   . Hyperlipidemia   . Kidney stones   . Pneumonia     Social History   Tobacco Use  . Smoking status: Current Every Day Smoker    Packs/day: 0.50    Types: Cigarettes  . Smokeless tobacco: Never Used  . Tobacco comment: trying to quit  Substance Use Topics  . Alcohol use: Yes    Alcohol/week: 2.0 standard drinks    Types: 1 Glasses of wine, 1 Shots of liquor per week    Comment: social  . Drug use: No    Family History  Problem Relation Age of Onset  . Colon cancer Mother        dx in her early 94's  . Colon cancer Father        dx in his 47's  . Colon cancer Paternal Grandmother   . Esophageal cancer Neg Hx   . Inflammatory bowel  disease Neg Hx   . Liver disease Neg Hx   . Pancreatic cancer Neg Hx   . Stomach cancer Neg Hx     Allergies  Allergen Reactions  . Bactrim [Sulfamethoxazole-Trimethoprim] Rash    OBJECTIVE: Vitals:   11/07/19 0838  BP: 132/86  Pulse: 69  Temp: (!) 97.3 F (36.3 C)  TempSrc: Oral  Weight: 182 lb (82.6 kg)   Body mass index is 29.38 kg/m.  Physical Exam  Constitutional: He is oriented to person, place, and time and well-developed, well-nourished, and in no distress.  HENT:  Mouth/Throat: No oral lesions. Normal dentition. No dental caries.  Eyes: No scleral icterus.  Cardiovascular: Normal  rate, regular rhythm and normal heart sounds.  Pulmonary/Chest: Effort normal and breath sounds normal.  Abdominal: Soft. He exhibits no distension. There is no abdominal tenderness.  Lymphadenopathy:    He has no cervical adenopathy.  Neurological: He is alert and oriented to person, place, and time.  Skin: Skin is warm and dry. No rash noted.  Psychiatric: Mood and affect normal.    Lab Results Lab Results  Component Value Date   WBC 7.4 09/30/2018   HGB 14.3 09/30/2018   HCT 40.8 09/30/2018   MCV 94.7 09/30/2018   PLT 207 09/30/2018    Lab Results  Component Value Date   CREATININE 1.05 09/30/2018   BUN 16 09/30/2018   NA 139 09/30/2018   K 4.7 09/30/2018   CL 104 09/30/2018   CO2 30 09/30/2018    Lab Results  Component Value Date   ALT 20 09/30/2018   AST 27 09/30/2018   ALKPHOS 84 08/03/2018   BILITOT 0.5 09/30/2018    Lab Results  Component Value Date   CHOL 193 09/30/2018   HDL 63 09/30/2018   LDLCALC 114 (H) 09/30/2018   TRIG 68 09/30/2018   CHOLHDL 3.1 09/30/2018   Lab Results  Component Value Date   HEPBSAG NON-REACTIVE 04/09/2017   HEPBSAB REACTIVE (A) 04/09/2017   No results found for: HCVAB Lab Results  Component Value Date   CHLAMYDIAWP Negative 04/09/2017   N Negative 04/09/2017     Problem List Items Addressed This Visit      Unprioritized   HIV (human immunodeficiency virus infection) ([place]) - Primary (Chronic)    Will update pertinent labs today. Will continue on Tivicay and Prezcobix once a day with food. He has done well historically and I would suspect he is still exceptionally well controlled now.  He is comfortable with Q110m visits.  Vaccines discussed below.       Relevant Medications   darunavir-cobicistat (PREZCOBIX) 800-150 MG tablet   dolutegravir (TIVICAY) 50 MG tablet   Other Relevant Orders   HIV-1 RNA quant-no reflex-bld   T-helper cell (CD4)- ([facility] clinic only)   Basic metabolic panel   CBC with  Differential/Platelet   RPR   History of DVT (deep vein thrombosis)    On anticoagulation with low dose Apixaban (with coadministration of cobicistat). Doing well. Still has some moments where that part of the leg will kind of "fall asleep" almost or get a little tingly. He wears compression stockings and tries to remain in positions that do not compromise blood flow.       Healthcare maintenance    Received 1st dose of Moderna COVID vaccine with 2nd dose coming up soon. Also discussed that he would be a good candidate for Shingrix vaccine with a history of shingles in the past. He did receive Zostavax in  the past but even with this he would still be recommended to get th 2-dose Shingrix. Recommended to receive 1 month after he completes COVID vaccine.       Creatinine elevation    Most recently levels have normalized. He is on a tenofovir sparing regimen with Tivicay + Prezcobix currently. Repeat today for monitoring.         Janene Madeira, MSN, NP-C [facility] [place] [facility] Pager: [phone]  11/07/19 1:35 PM

## 2019-11-07 NOTE — Assessment & Plan Note (Signed)
Will update pertinent labs today. Will continue on Tivicay and Prezcobix once a day with food. He has done well historically and I would suspect he is still exceptionally well controlled now.  He is comfortable with Q20m visits.  Vaccines discussed below.

## 2019-11-07 NOTE — Assessment & Plan Note (Signed)
Most recently levels have normalized. He is on a tenofovir sparing regimen with Tivicay + Prezcobix currently. Repeat today for monitoring.

## 2019-11-08 LAB — T-HELPER CELL (CD4) - (RCID CLINIC ONLY)
CD4 % Helper T Cell: 27 % — ABNORMAL LOW (ref 33–65)
CD4 T Cell Abs: 1055 /uL (ref 400–1790)

## 2019-11-09 LAB — CBC WITH DIFFERENTIAL/PLATELET
Absolute Monocytes: 281 cells/uL (ref 200–950)
Basophils Absolute: 11 cells/uL (ref 0–200)
Basophils Relative: 0.2 %
Eosinophils Absolute: 50 cells/uL (ref 15–500)
Eosinophils Relative: 0.9 %
HCT: 41.8 % (ref 38.5–50.0)
Hemoglobin: 14.4 g/dL (ref 13.2–17.1)
Lymphs Abs: 4048 cells/uL — ABNORMAL HIGH (ref 850–3900)
MCH: 34.5 pg — ABNORMAL HIGH (ref 27.0–33.0)
MCHC: 34.4 g/dL (ref 32.0–36.0)
MCV: 100.2 fL — ABNORMAL HIGH (ref 80.0–100.0)
MPV: 10.1 fL (ref 7.5–12.5)
Monocytes Relative: 5.1 %
Neutro Abs: 1111 cells/uL — ABNORMAL LOW (ref 1500–7800)
Neutrophils Relative %: 20.2 %
Platelets: 180 10*3/uL (ref 140–400)
RBC: 4.17 10*6/uL — ABNORMAL LOW (ref 4.20–5.80)
RDW: 12.9 % (ref 11.0–15.0)
Total Lymphocyte: 73.6 %
WBC: 5.5 10*3/uL (ref 3.8–10.8)

## 2019-11-09 LAB — BASIC METABOLIC PANEL
BUN: 16 mg/dL (ref 7–25)
CO2: 29 mmol/L (ref 20–32)
Calcium: 9.7 mg/dL (ref 8.6–10.3)
Chloride: 104 mmol/L (ref 98–110)
Creat: 1.19 mg/dL (ref 0.70–1.25)
Glucose, Bld: 87 mg/dL (ref 65–99)
Potassium: 4.2 mmol/L (ref 3.5–5.3)
Sodium: 138 mmol/L (ref 135–146)

## 2019-11-09 LAB — HIV-1 RNA QUANT-NO REFLEX-BLD
HIV 1 RNA Quant: <20 copies/mL — AB
HIV-1 RNA Quant, Log: <1.3 Log copies/mL — AB

## 2019-11-09 LAB — RPR: RPR Ser Ql: NONREACTIVE

## 2020-01-31 IMAGING — CT CT HEAD W/O CM
3 of 7 series · 14 of 47 positions shown, 17 images · IV contrast (Omni 300)
Comparison: None.

CONTRAST:  75 cc Omnipaque 300

CLINICAL DATA: 62 y/o M; 2 days of left facial swelling. History of
Bell's palsy with deficits in the left-sided face.

EXAM:
CT HEAD WITHOUT CONTRAST
CT MAXILLOFACIAL WITH CONTRAST
TECHNIQUE: Multidetector CT imaging of the maxillofacial structures was
performed using the standard protocol with intravenous contrast.
Multiplanar CT image reconstructions of the maxillofacial structures
were also generated.
Multidetector CT imaging of the head was performed using the
standard protocol without intravenous contrast. Multiplanar CT image
reconstructions of the maxillofacial structures were also generated.

[Series 8: facialbone 2.0 st · axial · 0.42mm/px · z∈[-174,-36]mm · 9 of 83 slices shown, 12 images]
[im 7/83  brain]
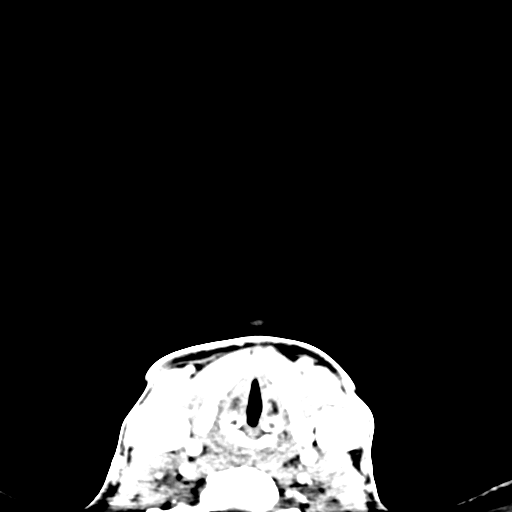
[im 7/83  bone]
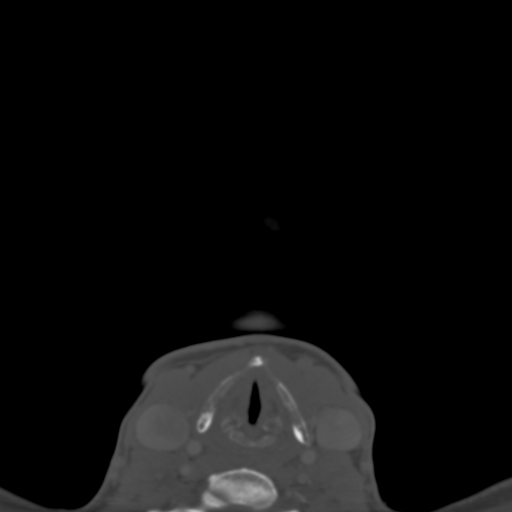
[im 14/83  brain]
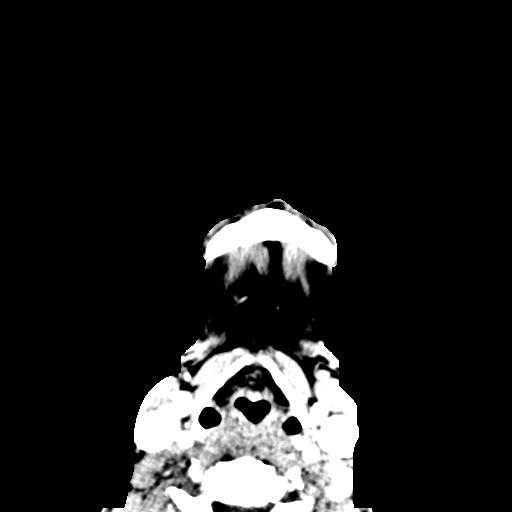
[im 28/83  brain]
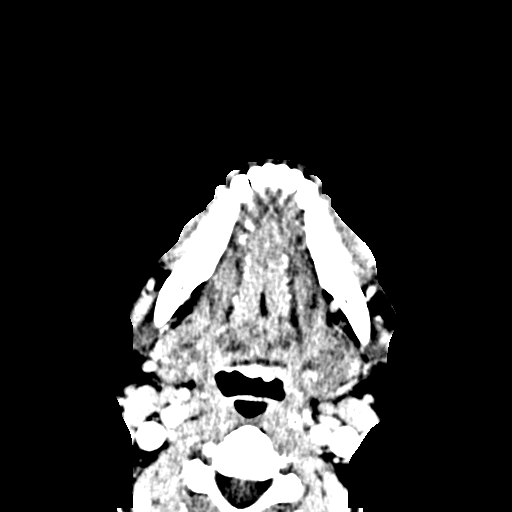
[im 35/83  brain]
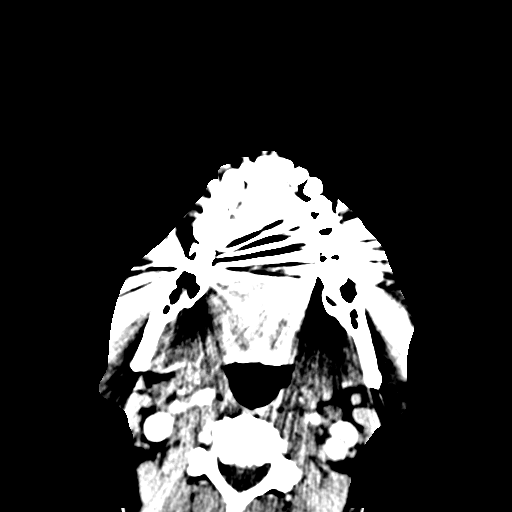
[im 42/83  brain]
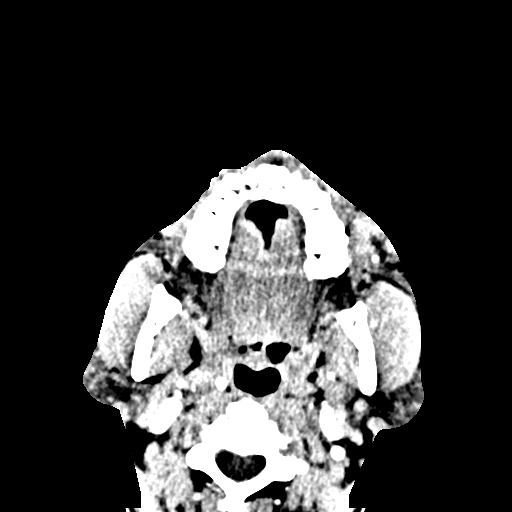
[im 42/83  bone]
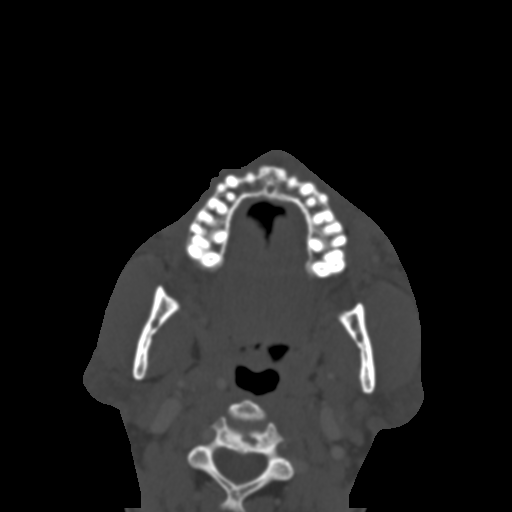
[im 48/83  brain]
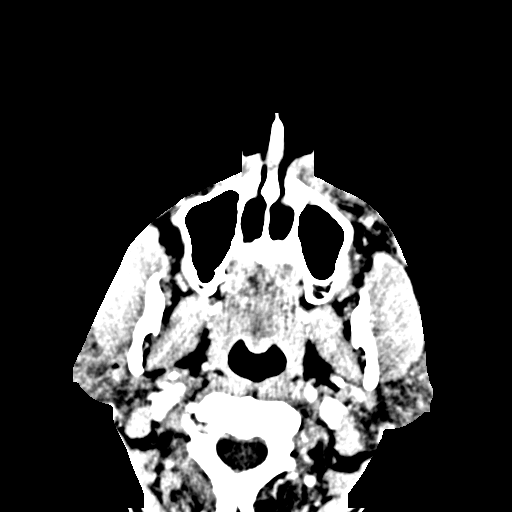
[im 55/83  brain]
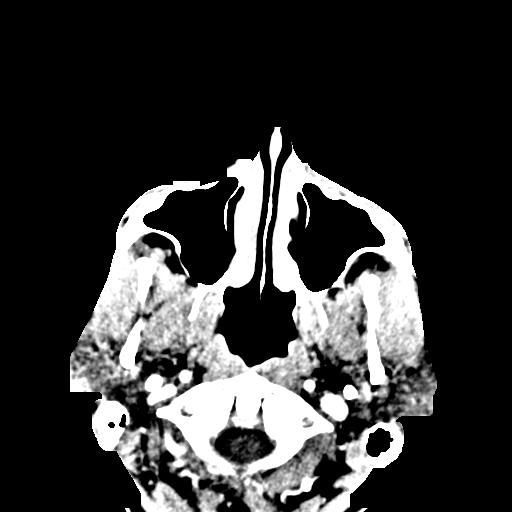
[im 69/83  brain]
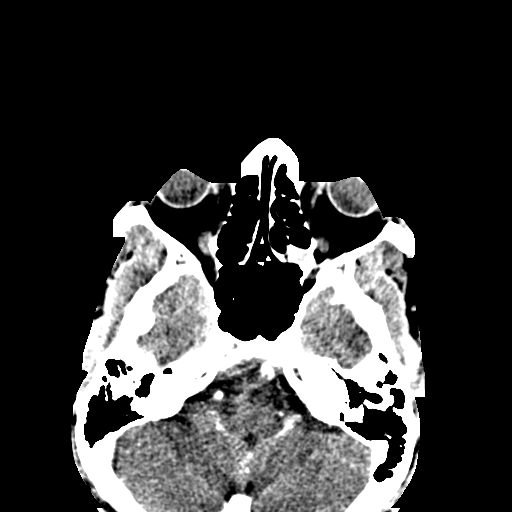
[im 76/83  brain]
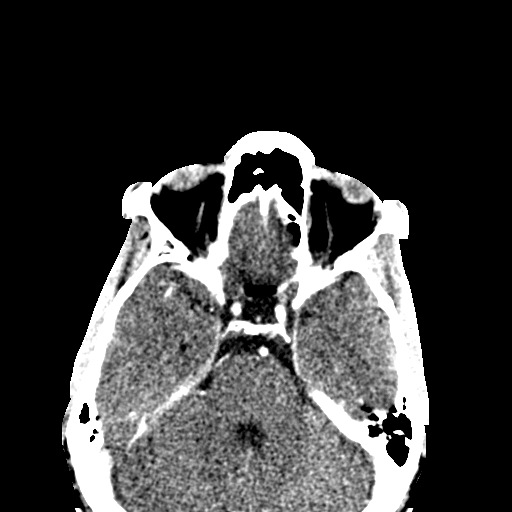
[im 76/83  bone]
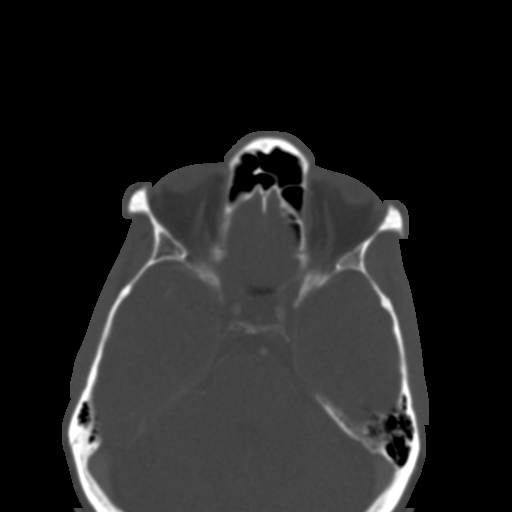

[Series 12: facialbone 2.0 cor st · coronal · 0.32mm/px · 3 of 85 slices shown]
[im 22/85  brain]
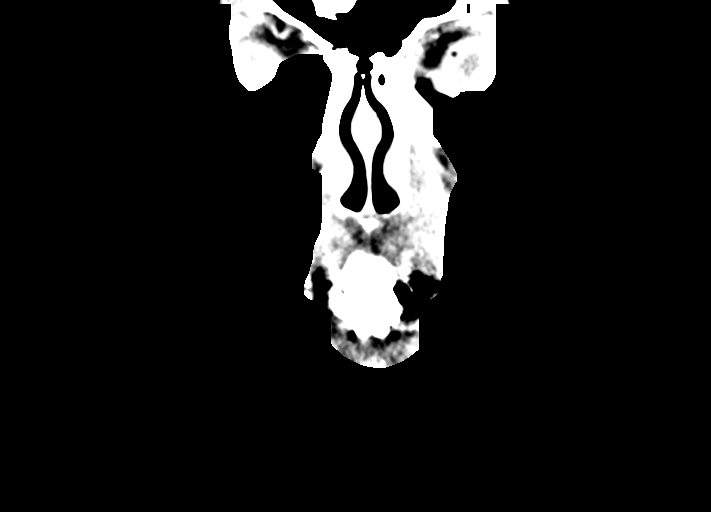
[im 43/85  brain]
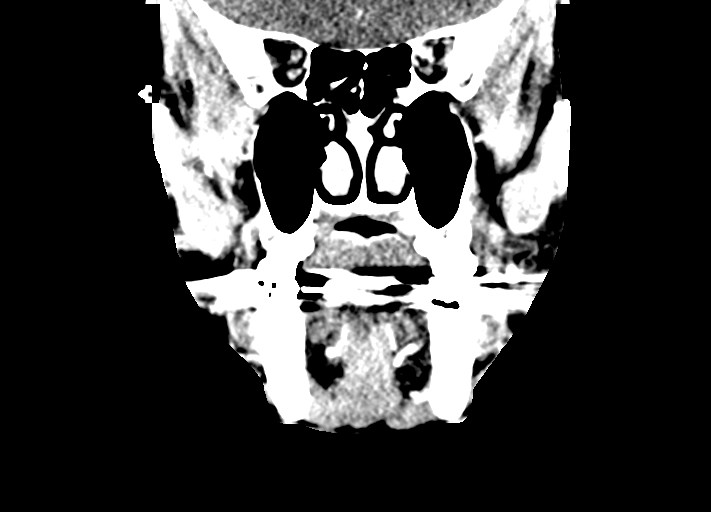
[im 64/85  brain]
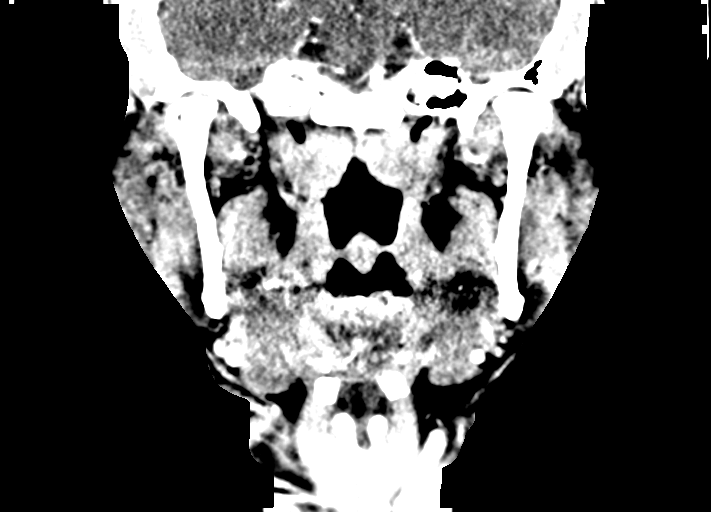

[Series 13: facialbone 2.0 sag st · sagittal · 0.35mm/px · 2 of 84 slices shown]
[im 28/84  brain]
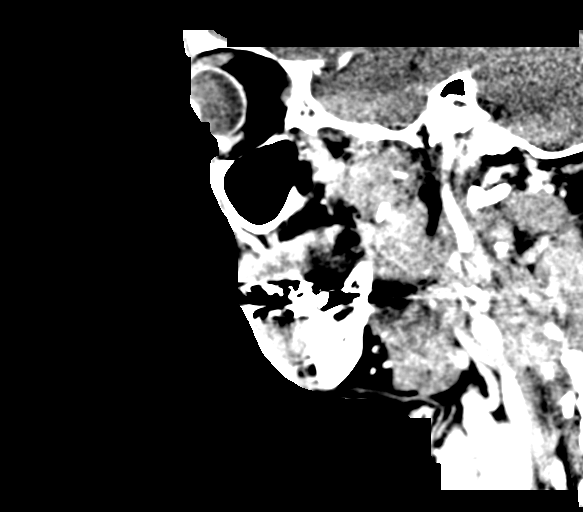
[im 56/84  brain]
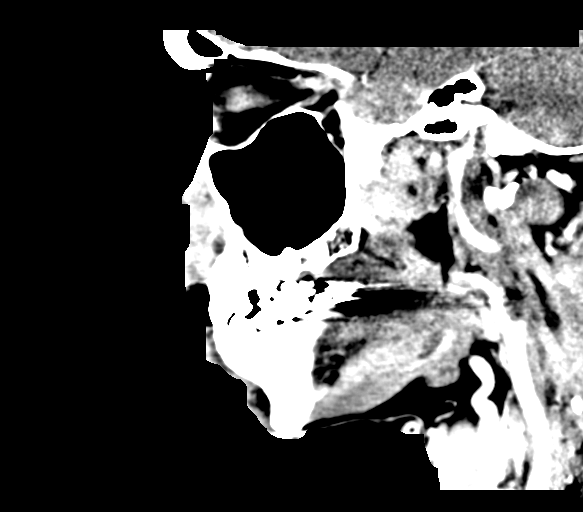

[14 of 47 positions shown; findings below may reference images not displayed]

FINDINGS: CT HEAD FINDINGS

Brain: No evidence of acute infarction, hemorrhage, hydrocephalus,
extra-axial collection or mass lesion/mass effect.

Vascular: No hyperdense vessel or unexpected calcification.

Skull: Normal. Negative for fracture or focal lesion.

Other: None.

CT MAXILLOFACIAL FINDINGS

Osseous: Periapical lucency associated with the left maxillary
canine. Overlying 7 mm rim enhancing collection in soft tissues and
surrounding inflammatory changes (series 8, image 45, series 11,
image 55, and series 12, image 27)..

Orbits: Negative. No traumatic or inflammatory finding.

Sinuses: Mild left maxillary sinus mucosal thickening. Left
posterior ethmoid air cell mucous retention cyst. No sinus fluid
levels. Normal aeration of mastoid air cells.

Soft tissues: Negative.
IMPRESSION: 1. Negative CT of the head.
2. 7 mm odontogenic abscess overlying the left maxillary alveolar
bone at base of nose associated with periapical disease of the left
maxillary canine. Surrounding inflammatory changes of superficial
soft tissues.

## 2020-05-14 ENCOUNTER — Encounter: Payer: Self-pay | Admitting: Infectious Diseases

## 2020-05-14 ENCOUNTER — Other Ambulatory Visit: Payer: Self-pay

## 2020-05-14 ENCOUNTER — Ambulatory Visit: Payer: Federal, State, Local not specified - PPO | Admitting: Infectious Diseases

## 2020-05-14 VITALS — Wt 176.0 lb

## 2020-05-14 DIAGNOSIS — Z86718 Personal history of other venous thrombosis and embolism: Secondary | ICD-10-CM | POA: Diagnosis not present

## 2020-05-14 DIAGNOSIS — Z79899 Other long term (current) drug therapy: Secondary | ICD-10-CM | POA: Diagnosis not present

## 2020-05-14 DIAGNOSIS — Z21 Asymptomatic human immunodeficiency virus [HIV] infection status: Secondary | ICD-10-CM | POA: Diagnosis not present

## 2020-05-14 DIAGNOSIS — Z23 Encounter for immunization: Secondary | ICD-10-CM

## 2020-05-14 DIAGNOSIS — Z720 Tobacco use: Secondary | ICD-10-CM

## 2020-05-14 NOTE — Progress Notes (Signed)
Name: Sean Conrad  DOB: [birth year] MRN: [id number] PCP: Renaldo Fiddler ([place])   Patient Active Problem List   Diagnosis Date Noted  . History of DVT (deep vein thrombosis) 05/09/2019  . High risk for colon cancer 08/10/2018  . Abnormal liver function test 08/10/2018  . History of colonic polyps 06/08/2018  . Erectile dysfunction 10/06/2017  . Healthcare maintenance 06/08/2017  . HIV (human immunodeficiency virus infection) ([place]) 04/23/2017  . Creatinine elevation 04/23/2017  . Tobacco abuse 04/23/2017    Subjective:  Brief ID:  Sean Conrad transferred care in August 2018 from [place] area (Dr. Ramond Dial MD). Retired since 2017. Dx Feb 1995 with history of Shingles   Previous Medications:   Truvada, AZT, Stribild and others he cannot recall  Prezcobix + Tivicay --> suppressed   Genotype:   "many" per his report. None were sent with transfer documentation.     No chief complaint on file.    HPI/ROS:  Sean Conrad is here today for 42-month follow-up for HIV care.  He reports he is doing very well.  Going back to work part-time at [employer].  He is taking his Tivicay and Prezcobix once daily as directed.  Continues on low-dose Eliquis history of blood clot.  No trouble obtaining his medications.  No new medicines to be clear.  He is still smoking occasionally.  Week in hopes of the next morning aggressive changes to stop smoking before the years out.  He received his Pfizer booster on Friday of this week and pretty well with that.  He would like his flu shot today if it is a good time.  Has had some trouble with sleeping and frequent waking at night.  He feels like this is pretty par for his normal routine.  He does sometimes have to get up out of his bedroom to do something to occupy his mind him to go back to sleep.  He does not necessarily feel like the compromising quality as he "makes up for it during the day sometimes."  Has had some ups and downs  with regards to his mood.  This is not abnormal for him and he feels like he has adequate resources and pills to deal with these moments himself.  We spent much of the visit catching up socially and chatting about various subjects    Review of Systems  Constitutional: Negative for chills, fever, malaise/fatigue and weight loss.  HENT: Negative for sore throat.        No dental problems  Respiratory: Negative for cough and sputum production.   Cardiovascular: Negative for chest pain and leg swelling.  Gastrointestinal: Negative for abdominal pain, diarrhea and vomiting.  Genitourinary: Negative for dysuria and flank pain.  Musculoskeletal: Negative for joint pain, myalgias and neck pain.  Skin: Negative for rash.  Neurological: Negative for dizziness, tingling and headaches.  Psychiatric/Behavioral: Negative for depression and substance abuse. The patient has insomnia. The patient is not nervous/anxious.      Past Medical History:  Diagnosis Date  . Bell's palsy 2015  . Colon polyps   . DVT (deep venous thrombosis) ([place]) 1992   Right calf  . Elevated serum creatinine   . Erectile dysfunction   . HIV infection ([place])   . Hyperlipidemia   . Kidney stones   . Pneumonia     Social History   Tobacco Use  . Smoking status: Current Every Day Smoker    Packs/day: 0.50    Types: Cigarettes  . Smokeless  tobacco: Never Used  . Tobacco comment: trying to quit  Vaping Use  . Vaping Use: Never used  Substance Use Topics  . Alcohol use: Yes    Alcohol/week: 2.0 standard drinks    Types: 1 Glasses of wine, 1 Shots of liquor per week    Comment: social  . Drug use: No     Allergies  Allergen Reactions  . Bactrim [Sulfamethoxazole-Trimethoprim] Rash    OBJECTIVE: Vitals:   05/14/20 0838  Weight: 176 lb (79.8 kg)   Body mass index is 28.41 kg/m.  Physical Exam Constitutional:      Appearance: Normal appearance. He is not ill-appearing.  HENT:     Head: Normocephalic.       Mouth/Throat:     Mouth: Mucous membranes are moist.     Pharynx: Oropharynx is clear.  Eyes:     General: No scleral icterus. Pulmonary:     Effort: Pulmonary effort is normal.  Musculoskeletal:        General: Normal range of motion.     Cervical back: Normal range of motion.  Skin:    Coloration: Skin is not jaundiced or pale.  Neurological:     Mental Status: He is alert and oriented to person, place, and time.  Psychiatric:        Mood and Affect: Mood normal.        Judgment: Judgment normal.     Lab Results Lab Results  Component Value Date   WBC 5.5 11/07/2019   HGB 14.4 11/07/2019   HCT 41.8 11/07/2019   MCV 100.2 (H) 11/07/2019   PLT 180 11/07/2019    Lab Results  Component Value Date   CREATININE 1.19 11/07/2019   BUN 16 11/07/2019   NA 138 11/07/2019   K 4.2 11/07/2019   CL 104 11/07/2019   CO2 29 11/07/2019    Lab Results  Component Value Date   ALT 20 09/30/2018   AST 27 09/30/2018   ALKPHOS 84 08/03/2018   BILITOT 0.5 09/30/2018    Lab Results  Component Value Date   CHOL 193 09/30/2018   HDL 63 09/30/2018   LDLCALC 114 (H) 09/30/2018   TRIG 68 09/30/2018   CHOLHDL 3.1 09/30/2018   Lab Results  Component Value Date   HEPBSAG NON-REACTIVE 04/09/2017   HEPBSAB REACTIVE (A) 04/09/2017   No results found for: HCVAB Lab Results  Component Value Date   CHLAMYDIAWP Negative 04/09/2017   N Negative 04/09/2017     Problem List Items Addressed This Visit      Unprioritized   Tobacco abuse    Sean Conrad continues to smoke a few cigarettes a week.  He would like to get more intentional about completely stopping before he leaves out.      HIV (human immunodeficiency virus infection) ([place]) - Primary (Chronic)    Sean Conrad is doing very well on his regimen of once daily Tivicay and Prezcobix.  We will update pertinent labs today including a lipid panel as he is fasting.  No new medications and no interactions noted.  He is stable on his blood thinner  without any signs of overt or excessive bleeding/bruising. He has had his booster for Covid and we will get his flu shot today. He can return to clinic in 6 to 9 months.  Labs will be done at the visit.      Relevant Orders   HIV-1 RNA quant-no reflex-bld   T-helper cell (CD4)- ([facility] clinic only)   History of  DVT (deep vein thrombosis)    Continues on 2.5 mg twice daily of Eliquis.       Other Visit Diagnoses    High risk medication use       Relevant Orders   Lipid panel      Janene Madeira, MSN, NP-C [place] [facility] [place] [facility] Pager: [phone]  05/14/20 9:39 AM

## 2020-05-14 NOTE — Assessment & Plan Note (Signed)
Sean Conrad continues to smoke a few cigarettes a week.  He would like to get more intentional about completely stopping before he leaves out.

## 2020-05-14 NOTE — Assessment & Plan Note (Signed)
Sean Conrad is doing very well on his regimen of once daily Tivicay and Prezcobix.  We will update pertinent labs today including a lipid panel as he is fasting.  No new medications and no interactions noted.  He is stable on his blood thinner without any signs of overt or excessive bleeding/bruising. He has had his booster for Covid and we will get his flu shot today. He can return to clinic in 6 to 9 months.  Labs will be done at the visit.

## 2020-05-14 NOTE — Assessment & Plan Note (Signed)
Continues on 2.5 mg twice daily of Eliquis.

## 2020-05-14 NOTE — Patient Instructions (Addendum)
Nice to see you!  You should be good on refills but if needed happy to help before your next appointment.   We gave you your flu shot today.   Please stop by the lab on your way out - will check your cholesterol panel also this time.   Please return in 6-9 months - I am comfortable with either so please schedule to your comfort.

## 2020-05-15 LAB — T-HELPER CELL (CD4) - (RCID CLINIC ONLY)
CD4 % Helper T Cell: 26 % — ABNORMAL LOW (ref 33–65)
CD4 T Cell Abs: 862 /uL (ref 400–1790)

## 2020-05-15 LAB — LIPID PANEL: Non-HDL Cholesterol (Calc): 187 mg/dL (calc) — ABNORMAL HIGH (ref <130)

## 2020-05-16 LAB — HIV-1 RNA QUANT-NO REFLEX-BLD
HIV 1 RNA Quant: <20 Copies/mL
HIV-1 RNA Quant, Log: <1.3 Log cps/mL

## 2020-05-16 LAB — LIPID PANEL
Cholesterol: 252 mg/dL — ABNORMAL HIGH (ref <200)
HDL: 65 mg/dL (ref >=40)
LDL Cholesterol (Calc): 160 mg/dL (calc) — ABNORMAL HIGH
Total CHOL/HDL Ratio: 3.9 (calc) (ref <5.0)
Triglycerides: 145 mg/dL (ref <150)

## 2020-06-12 DIAGNOSIS — E7800 Pure hypercholesterolemia, unspecified: Secondary | ICD-10-CM | POA: Diagnosis not present

## 2020-06-12 DIAGNOSIS — Z125 Encounter for screening for malignant neoplasm of prostate: Secondary | ICD-10-CM | POA: Diagnosis not present

## 2020-06-26 DIAGNOSIS — B20 Human immunodeficiency virus [HIV] disease: Secondary | ICD-10-CM | POA: Diagnosis not present

## 2020-06-26 DIAGNOSIS — R82998 Other abnormal findings in urine: Secondary | ICD-10-CM | POA: Diagnosis not present

## 2020-06-26 DIAGNOSIS — Z0000 Encounter for general adult medical examination without abnormal findings: Secondary | ICD-10-CM | POA: Diagnosis not present

## 2020-07-12 DIAGNOSIS — Z1212 Encounter for screening for malignant neoplasm of rectum: Secondary | ICD-10-CM | POA: Diagnosis not present

## 2020-08-24 ENCOUNTER — Other Ambulatory Visit: Payer: Self-pay

## 2020-08-24 DIAGNOSIS — B20 Human immunodeficiency virus [HIV] disease: Secondary | ICD-10-CM

## 2020-08-24 MED ORDER — PREZCOBIX 800-150 MG PO TABS: 1.0000 | ORAL_TABLET | Freq: Every day | ORAL | 1 refills | Status: DC

## 2020-08-24 MED ORDER — TIVICAY 50 MG PO TABS: 50.0000 mg | ORAL_TABLET | Freq: Every day | ORAL | 1 refills | Status: DC

## 2020-09-01 ENCOUNTER — Other Ambulatory Visit: Payer: Self-pay | Admitting: Infectious Diseases

## 2020-09-01 DIAGNOSIS — B20 Human immunodeficiency virus [HIV] disease: Secondary | ICD-10-CM

## 2020-11-02 ENCOUNTER — Ambulatory Visit (INDEPENDENT_AMBULATORY_CARE_PROVIDER_SITE_OTHER): Payer: Federal, State, Local not specified - PPO

## 2020-11-02 ENCOUNTER — Other Ambulatory Visit: Payer: Self-pay

## 2020-11-02 DIAGNOSIS — Z23 Encounter for immunization: Secondary | ICD-10-CM

## 2020-11-02 NOTE — Progress Notes (Signed)
   Covid-19 Vaccination Clinic  Name:  Sean Conrad    MRN: [id number] DOB: [birth year]  11/02/2020  [name] was observed post Covid-19 immunization for 15 minutes  without incident. He was provided with Vaccine Information Sheet and instruction to access the V-Safe system.   [name] was instructed to call 911 with any severe reactions post vaccine: Marland Kitchen Difficulty breathing  . Swelling of face and throat  . A fast heartbeat  . A bad rash all over body  . Dizziness and weakness

## 2021-01-29 ENCOUNTER — Ambulatory Visit: Payer: Federal, State, Local not specified - PPO | Admitting: Infectious Diseases

## 2021-02-01 ENCOUNTER — Ambulatory Visit: Payer: Federal, State, Local not specified - PPO | Admitting: Infectious Diseases

## 2021-02-01 ENCOUNTER — Encounter: Payer: Self-pay | Admitting: Infectious Diseases

## 2021-02-01 ENCOUNTER — Other Ambulatory Visit: Payer: Self-pay

## 2021-02-01 DIAGNOSIS — F4329 Adjustment disorder with other symptoms: Secondary | ICD-10-CM | POA: Diagnosis not present

## 2021-02-01 DIAGNOSIS — B20 Human immunodeficiency virus [HIV] disease: Secondary | ICD-10-CM | POA: Diagnosis not present

## 2021-02-01 DIAGNOSIS — R7989 Other specified abnormal findings of blood chemistry: Secondary | ICD-10-CM

## 2021-02-01 DIAGNOSIS — Z86718 Personal history of other venous thrombosis and embolism: Secondary | ICD-10-CM | POA: Diagnosis not present

## 2021-02-01 NOTE — Patient Instructions (Addendum)
Please let us know when you need refills for tivicay + prezcobix.   Flu shot in the fall (September / October)  [place]  [email or web address] [phone]  Stop by the lab on your way out please   Let me know if I can be of any help!

## 2021-02-02 DIAGNOSIS — F4329 Adjustment disorder with other symptoms: Secondary | ICD-10-CM | POA: Insufficient documentation

## 2021-02-02 NOTE — Progress Notes (Signed)
Name: Sean Conrad  DOB: 10/25/1955 MRN: [id number] PCP: Renaldo Fiddler ([place])   Subjective:  Brief ID:  Sean Conrad transferred care in August 2018 from [place] area (Dr. Ramond Dial MD). Retired since 2017. Dx Feb 1995 with history of Shingles   Previous Medications:  Truvada, AZT, Stribild and others he cannot recall Prezcobix + Tivicay --> suppressed   Genotype:  "many" per his report. None were sent with transfer documentation.     Chief Complaint  Patient presents with   Follow-up    B20       HPI/ROS:  Sean Conrad is here today for routine 9 month follow up for HIV disease, well controlled on once daily Tivicay and Prezcobix.   No new medication changes to report. He stopped working at [employer] and considering maybe another part time opportunity that has a little more flexibility and self direction. He states he has picked up a few pounds and stopped walking. Major concern he would like to discuss today is the fact that he feels as if he may need to see a counselor. He spent time discussing a particular event that happened 3 months ago around [place] where he severed ties with a long-standing close friend. He has tried to not let this impact his sleep or daily activities. Does not feel like he is depressed necessarily, not sure what to "label" it.   Has a follow up with Dr. Osborne Casco coming up soon. No dental needs identified today. Received all recommended COVID shots including 4th booster in April 2022. Received Shingrix series with last dose in September 2021.     Review of Systems  Constitutional:  Negative for chills and fever.  HENT:  Negative for tinnitus.   Eyes:  Negative for blurred vision and photophobia.  Respiratory:  Negative for cough and sputum production.   Cardiovascular:  Negative for chest pain.  Gastrointestinal:  Negative for diarrhea, nausea and vomiting.  Genitourinary:  Negative for dysuria.  Skin:  Negative for rash.   Neurological:  Negative for headaches.  Psychiatric/Behavioral:         Some trouble sleeping occasionally and changes to mood - overwhelmed / maybe anxious.     Past Medical History:  Diagnosis Date   Bell's palsy 2015   Colon polyps    DVT (deep venous thrombosis) (HCC) 1992   Right calf   Elevated serum creatinine    Erectile dysfunction    HIV infection ([place])    Hyperlipidemia    Kidney stones    Pneumonia     Social History   Tobacco Use   Smoking status: Every Day    Packs/day: 0.50    Pack years: 0.00    Types: Cigarettes   Smokeless tobacco: Never   Tobacco comments:    trying to quit  Vaping Use   Vaping Use: Never used  Substance Use Topics   Alcohol use: Yes    Alcohol/week: 2.0 standard drinks    Types: 1 Glasses of wine, 1 Shots of liquor per week    Comment: social   Drug use: No     Allergies  Allergen Reactions   Bactrim [Sulfamethoxazole-Trimethoprim] Rash    OBJECTIVE: Vitals:   02/01/21 0934  BP: (!) 131/92  Pulse: 80  Temp: 97.6 F (36.4 C)  TempSrc: Oral  Weight: 183 lb (83 kg)   Body mass index is 29.54 kg/m.  Physical Exam Vitals reviewed.  Constitutional:      Appearance: He is well-developed.  Comments: Seated comfortably in chair during visit.   HENT:     Mouth/Throat:     Dentition: Normal dentition. No dental abscesses.  Cardiovascular:     Rate and Rhythm: Normal rate and regular rhythm.  Pulmonary:     Effort: Pulmonary effort is normal.  Abdominal:     General: There is no distension.  Lymphadenopathy:     Cervical: No cervical adenopathy.  Skin:    General: Skin is warm and dry.     Findings: No rash.  Neurological:     Mental Status: He is alert and oriented to person, place, and time.  Psychiatric:        Mood and Affect: Mood normal.        Behavior: Behavior normal.        Judgment: Judgment normal.     Comments: In good spirits today and engaged in care discussion.     Lab Results Lab  Results  Component Value Date   WBC 6.4 02/01/2021   HGB 15.5 02/01/2021   HCT 45.2 02/01/2021   MCV 97.4 02/01/2021   PLT 211 02/01/2021    Lab Results  Component Value Date   CREATININE 1.22 02/01/2021   BUN 17 02/01/2021   NA 141 02/01/2021   K 4.8 02/01/2021   CL 107 02/01/2021   CO2 29 02/01/2021    Lab Results  Component Value Date   ALT 29 02/01/2021   AST 33 02/01/2021   ALKPHOS 84 08/03/2018   BILITOT 0.4 02/01/2021    Lab Results  Component Value Date   CHOL 255 (H) 02/01/2021   HDL 56 02/01/2021   LDLCALC 168 (H) 02/01/2021   TRIG 165 (H) 02/01/2021   CHOLHDL 4.6 02/01/2021   Lab Results  Component Value Date   HEPBSAG NON-REACTIVE 04/09/2017   HEPBSAB REACTIVE (A) 04/09/2017   No results found for: HCVAB Lab Results  Component Value Date   CHLAMYDIAWP Negative 04/09/2017   N Negative 04/09/2017     Problem List Items Addressed This Visit       Unprioritized   HIV (human immunodeficiency virus infection) ([place]) (Chronic)    Doing well on Prezcobix QD + Tivicay QD. No new mediations to declare. His adherence has always been perfect and I suspect no different now. We will update pertinent labs today and he will contact our office for refills (prefers 90-day supply at a time).  Vaccines up to date including COVID booster.  Flu shot recommended around October.  Return in about 9 months (around 11/02/2021).        Relevant Orders   HIV-1 RNA quant-no reflex-bld   COMPLETE METABOLIC PANEL WITH GFR (Completed)   CBC with Differential/Platelet (Completed)   Lipid panel (Completed)   T-helper cells (CD4) count   History of DVT (deep vein thrombosis)    He will need to continue reduced dose Eliquis 2.5 mg BID to accommodate for drug interaction with Prezcobix.  This is managed by his PCP.        Creatinine elevation    Continue tenofovir sparing regimen for ART. Will follow up results today.  Intermittently mildly elevated, likely not pathologic and  potentially due to dolutegravir.        Adjustment reaction with mixed emotional features    It's hard for him to clearly convey a name to how he has been feeling since separating ties with his best friend. His depression screen is negative. We discussed that he is likely having prolonged grief  reaction that he has yet to really find closure with. I think it would be a great idea for Sean Conrad to start some counseling. I will refer him to [facility] - he will notify me if he needs additional recommendations or referral if he needs someone in network for him.         I spent 25 minutes with Mr. Sakata in discussion about the above points including stable existing and 1 new problem.    Janene Madeira, MSN, NP-C [facility] [place] Pager: [phone]

## 2021-02-02 NOTE — Assessment & Plan Note (Signed)
It's hard for him to clearly convey a name to how he has been feeling since separating ties with his best friend. His depression screen is negative. We discussed that he is likely having prolonged grief reaction that he has yet to really find closure with. I think it would be a great idea for [name] to start some counseling. I will refer him to [place] - he will notify me if he needs additional recommendations or referral if he needs someone in network for him.

## 2021-02-02 NOTE — Assessment & Plan Note (Signed)
Doing well on Prezcobix QD + Tivicay QD. No new mediations to declare. His adherence has always been perfect and I suspect no different now. We will update pertinent labs today and he will contact our office for refills (prefers 90-day supply at a time).  Vaccines up to date including COVID booster.  Flu shot recommended around October.  Return in about 9 months (around 11/02/2021).

## 2021-02-02 NOTE — Assessment & Plan Note (Signed)
He will need to continue reduced dose Eliquis 2.5 mg BID to accommodate for drug interaction with Prezcobix.  This is managed by his PCP.

## 2021-02-02 NOTE — Assessment & Plan Note (Signed)
Continue tenofovir sparing regimen for ART. Will follow up results today.  Intermittently mildly elevated, likely not pathologic and potentially due to dolutegravir.

## 2021-02-04 ENCOUNTER — Telehealth: Payer: Self-pay

## 2021-02-04 LAB — CBC WITH DIFFERENTIAL/PLATELET
Absolute Monocytes: 346 cells/uL (ref 200–950)
Basophils Absolute: 19 cells/uL (ref 0–200)
Basophils Relative: 0.3 %
Eosinophils Absolute: 51 cells/uL (ref 15–500)
Eosinophils Relative: 0.8 %
HCT: 45.2 % (ref 38.5–50.0)
Hemoglobin: 15.5 g/dL (ref 13.2–17.1)
Lymphs Abs: 4019 cells/uL — ABNORMAL HIGH (ref 850–3900)
MCH: 33.4 pg — ABNORMAL HIGH (ref 27.0–33.0)
MCHC: 34.3 g/dL (ref 32.0–36.0)
MCV: 97.4 fL (ref 80.0–100.0)
MPV: 9.8 fL (ref 7.5–12.5)
Monocytes Relative: 5.4 %
Neutro Abs: 1965 cells/uL (ref 1500–7800)
Neutrophils Relative %: 30.7 %
Platelets: 211 10*3/uL (ref 140–400)
RBC: 4.64 10*6/uL (ref 4.20–5.80)
RDW: 13.2 % (ref 11.0–15.0)
Total Lymphocyte: 62.8 %
WBC: 6.4 10*3/uL (ref 3.8–10.8)

## 2021-02-04 LAB — COMPLETE METABOLIC PANEL WITH GFR
AG Ratio: 1.9 (calc) (ref 1.0–2.5)
ALT: 29 U/L (ref 9–46)
AST: 33 U/L (ref 10–35)
Albumin: 4.5 g/dL (ref 3.6–5.1)
Alkaline phosphatase (APISO): 76 U/L (ref 35–144)
BUN: 17 mg/dL (ref 7–25)
CO2: 29 mmol/L (ref 20–32)
Calcium: 9.7 mg/dL (ref 8.6–10.3)
Chloride: 107 mmol/L (ref 98–110)
Creat: 1.22 mg/dL (ref 0.70–1.25)
GFR, Est African American: 73 mL/min/{1.73_m2} (ref >=60)
GFR, Est Non African American: 63 mL/min/{1.73_m2} (ref >=60)
Globulin: 2.4 g/dL (calc) (ref 1.9–3.7)
Glucose, Bld: 82 mg/dL (ref 65–99)
Potassium: 4.8 mmol/L (ref 3.5–5.3)
Sodium: 141 mmol/L (ref 135–146)
Total Bilirubin: 0.4 mg/dL (ref 0.2–1.2)
Total Protein: 6.9 g/dL (ref 6.1–8.1)

## 2021-02-04 LAB — T-HELPER CELLS (CD4) COUNT (NOT AT ARMC)
Absolute CD4: 1014 cells/uL (ref 490–1740)
CD4 T Helper %: 25 % — ABNORMAL LOW (ref 30–61)
Total lymphocyte count: 4034 cells/uL — ABNORMAL HIGH (ref 850–3900)

## 2021-02-04 LAB — LIPID PANEL
Cholesterol: 255 mg/dL — ABNORMAL HIGH (ref <200)
HDL: 56 mg/dL (ref >=40)
LDL Cholesterol (Calc): 168 mg/dL (calc) — ABNORMAL HIGH
Non-HDL Cholesterol (Calc): 199 mg/dL (calc) — ABNORMAL HIGH (ref <130)
Total CHOL/HDL Ratio: 4.6 (calc) (ref <5.0)
Triglycerides: 165 mg/dL — ABNORMAL HIGH (ref <150)

## 2021-02-04 LAB — HIV-1 RNA QUANT-NO REFLEX-BLD
HIV 1 RNA Quant: NOT DETECTED Copies/mL
HIV-1 RNA Quant, Log: NOT DETECTED Log cps/mL

## 2021-02-04 NOTE — Telephone Encounter (Signed)
-----   Message from [place] Callas, NP sent at 02/04/2021  2:32 PM EDT ----- Regarding: can we fax his recent labs to his PCP please? his cholesterol needs some attention and he has annual visit with him soon. TY  ----- Message ----- From: Cheyenne Adas Lab Results In Sent: 02/01/2021   4:09 PM EDT To: Wenona Callas, NP

## 2021-02-04 NOTE — Telephone Encounter (Signed)
Most recent labs faxed to patient's PCP, Dr. Osborne Casco. Fax number: [phone]  Carlean Purl, RN

## 2021-03-12 ENCOUNTER — Other Ambulatory Visit: Payer: Self-pay | Admitting: Infectious Diseases

## 2021-03-12 DIAGNOSIS — B20 Human immunodeficiency virus [HIV] disease: Secondary | ICD-10-CM

## 2021-03-26 DIAGNOSIS — K080 Exfoliation of teeth due to systemic causes: Secondary | ICD-10-CM | POA: Diagnosis not present

## 2021-04-09 DIAGNOSIS — K029 Dental caries, unspecified: Secondary | ICD-10-CM | POA: Diagnosis not present

## 2021-05-01 DIAGNOSIS — K029 Dental caries, unspecified: Secondary | ICD-10-CM | POA: Diagnosis not present

## 2021-05-01 DIAGNOSIS — K069 Disorder of gingiva and edentulous alveolar ridge, unspecified: Secondary | ICD-10-CM | POA: Diagnosis not present

## 2021-07-05 DIAGNOSIS — E7800 Pure hypercholesterolemia, unspecified: Secondary | ICD-10-CM | POA: Diagnosis not present

## 2021-07-05 DIAGNOSIS — Z125 Encounter for screening for malignant neoplasm of prostate: Secondary | ICD-10-CM | POA: Diagnosis not present

## 2021-07-12 DIAGNOSIS — B20 Human immunodeficiency virus [HIV] disease: Secondary | ICD-10-CM | POA: Diagnosis not present

## 2021-07-12 DIAGNOSIS — Z0000 Encounter for general adult medical examination without abnormal findings: Secondary | ICD-10-CM | POA: Diagnosis not present

## 2021-07-12 DIAGNOSIS — R82998 Other abnormal findings in urine: Secondary | ICD-10-CM | POA: Diagnosis not present

## 2021-07-12 DIAGNOSIS — Z1331 Encounter for screening for depression: Secondary | ICD-10-CM | POA: Diagnosis not present

## 2021-07-12 DIAGNOSIS — Z1339 Encounter for screening examination for other mental health and behavioral disorders: Secondary | ICD-10-CM | POA: Diagnosis not present

## 2021-08-29 ENCOUNTER — Other Ambulatory Visit: Payer: Self-pay

## 2021-08-29 DIAGNOSIS — B20 Human immunodeficiency virus [HIV] disease: Secondary | ICD-10-CM

## 2021-08-29 MED ORDER — PREZCOBIX 800-150 MG PO TABS
1.0000 | ORAL_TABLET | Freq: Every day | ORAL | 1 refills | Status: DC
Start: 2021-08-29 — End: 2021-11-04

## 2021-08-29 MED ORDER — TIVICAY 50 MG PO TABS: 50.0000 mg | ORAL_TABLET | Freq: Every day | ORAL | 1 refills | Status: DC

## 2021-09-30 DIAGNOSIS — K080 Exfoliation of teeth due to systemic causes: Secondary | ICD-10-CM | POA: Diagnosis not present

## 2021-11-04 ENCOUNTER — Encounter: Payer: Self-pay | Admitting: Infectious Diseases

## 2021-11-04 ENCOUNTER — Other Ambulatory Visit: Payer: Self-pay

## 2021-11-04 ENCOUNTER — Ambulatory Visit: Payer: Federal, State, Local not specified - PPO | Admitting: Infectious Diseases

## 2021-11-04 VITALS — BP 137/87 | HR 68 | Temp 98.3°F | Ht 66.0 in | Wt 179.0 lb

## 2021-11-04 DIAGNOSIS — R7989 Other specified abnormal findings of blood chemistry: Secondary | ICD-10-CM

## 2021-11-04 DIAGNOSIS — B20 Human immunodeficiency virus [HIV] disease: Secondary | ICD-10-CM

## 2021-11-04 DIAGNOSIS — F4329 Adjustment disorder with other symptoms: Secondary | ICD-10-CM | POA: Diagnosis not present

## 2021-11-04 MED ORDER — TIVICAY 50 MG PO TABS: 50.0000 mg | ORAL_TABLET | Freq: Every day | ORAL | 3 refills | Status: DC

## 2021-11-04 MED ORDER — PREZCOBIX 800-150 MG PO TABS: 1.0000 | ORAL_TABLET | Freq: Every day | ORAL | 3 refills | Status: DC

## 2021-11-04 NOTE — Progress Notes (Signed)
? ?Name: Sean Conrad  ?DOB: 12/30/1955 ?MRN: [id number] ?PCP: Sean Conrad [facility]) ? ? ?Subjective:  ?Brief ID:  ?Sean Conrad transferred care in August 2018 from [place] area (Dr. Ramond Dial MD). Retired since 2017. Dx Feb 1995 with history of Shingles  ? ?Previous Medications:  ?Truvada, AZT, Stribild and others he cannot recall ?Prezcobix + Tivicay --> suppressed  ? ?Genotype:  ?"many" per his report. None were sent with transfer documentation.  ? ? ? ?Chief Complaint  ?Patient presents with  ? Follow-up  ? ? ? ? ?HPI/ROS:  ?Sean Conrad is here today for routine 9 month follow up for HIV. Continues on Tivicay + Prezcobix once daily with food. No changes in his health since last OV. No new medications. Continues on Eliquis 2.5 mg BID (dose adjusted for concurrent cobicistat).  ? ?We spent time discussing events and updates since last visit together. He has not met with a counselor yet. Looked at the [facility] portal to get an idea as to who is in network. Still interested in services and we spent time discussing the details here today.  ? ? ? ?Review of Systems  ?Constitutional:  Negative for chills, fever, malaise/fatigue and weight loss.  ?HENT:  Negative for sore throat.   ?     No dental problems  ?Respiratory:  Negative for cough and sputum production.   ?Cardiovascular:  Negative for chest pain and leg swelling.  ?Gastrointestinal:  Negative for abdominal pain, diarrhea and vomiting.  ?Genitourinary:  Negative for dysuria and flank pain.  ?Musculoskeletal:  Negative for joint pain, myalgias and neck pain.  ?Skin:  Negative for rash.  ?Neurological:  Negative for dizziness, tingling and headaches.  ?Psychiatric/Behavioral:  Negative for depression.   ?     Feels he can be short with people / easily irritated at times  ? ? ?Past Medical History:  ?Diagnosis Date  ? Bell's palsy 2015  ? Colon polyps   ? DVT (deep venous thrombosis) ([place]) 1992  ? Right calf  ? Elevated serum creatinine   ?  Erectile dysfunction   ? HIV infection ([place])   ? Hyperlipidemia   ? Kidney stones   ? Pneumonia   ? ? ?Social History  ? ?Tobacco Use  ? Smoking status: Every Day  ?  Packs/day: 0.10  ?  Types: Cigarettes  ? Smokeless tobacco: Never  ? Tobacco comments:  ?  trying to quit, goes through a pack in about a week  ?Vaping Use  ? Vaping Use: Never used  ?Substance Use Topics  ? Alcohol use: Yes  ?  Alcohol/week: 2.0 standard drinks  ?  Types: 1 Glasses of wine, 1 Shots of liquor per week  ?  Comment: occasionally  ? Drug use: No  ? ? ? ?Allergies  ?Allergen Reactions  ? Bactrim [Sulfamethoxazole-Trimethoprim] Rash  ? ? ?OBJECTIVE: ?Vitals:  ? 11/04/21 0943  ?BP: 137/87  ?Pulse: 68  ?Temp: 98.3 ?F (36.8 ?C)  ?TempSrc: Oral  ?SpO2: 99%  ?Weight: 179 lb (81.2 kg)  ?Height: _0  (1.676 m)  ? ?Body mass index is 28.89 kg/m?. ? ?Physical Exam ?Vitals reviewed.  ?Constitutional:   ?   Appearance: He is well-developed.  ?   Comments: Seated comfortably in chair during visit.   ?HENT:  ?   Mouth/Throat:  ?   Dentition: Normal dentition. No dental abscesses.  ?Cardiovascular:  ?   Rate and Rhythm: Normal rate and regular rhythm.  ?Pulmonary:  ?   Effort:  Pulmonary effort is normal.  ?Abdominal:  ?   General: There is no distension.  ?Lymphadenopathy:  ?   Cervical: No cervical adenopathy.  ?Skin: ?   General: Skin is warm and dry.  ?   Findings: No rash.  ?Neurological:  ?   Mental Status: He is alert and oriented to person, place, and time.  ?Psychiatric:     ?   Mood and Affect: Mood normal.     ?   Behavior: Behavior normal.     ?   Judgment: Judgment normal.  ?   Comments: In good spirits today and engaged in care discussion.   ? ? ?Lab Results ?Lab Results  ?Component Value Date  ? WBC 6.6 11/04/2021  ? HGB 14.8 11/04/2021  ? HCT 44.1 11/04/2021  ? MCV 96.9 11/04/2021  ? PLT 198 11/04/2021  ?  ?Lab Results  ?Component Value Date  ? CREATININE 1.17 11/04/2021  ? BUN 15 11/04/2021  ? NA 141 11/04/2021  ? K 4.7 11/04/2021  ? CL  105 11/04/2021  ? CO2 31 11/04/2021  ?  ?Lab Results  ?Component Value Date  ? ALT 27 11/04/2021  ? AST 32 11/04/2021  ? ALKPHOS 84 08/03/2018  ? BILITOT 0.5 11/04/2021  ?  ?Lab Results  ?Component Value Date  ? CHOL 255 (H) 02/01/2021  ? HDL 56 02/01/2021  ? LDLCALC 168 (H) 02/01/2021  ? TRIG 165 (H) 02/01/2021  ? CHOLHDL 4.6 02/01/2021  ? ?Lab Results  ?Component Value Date  ? HEPBSAG NON-REACTIVE 04/09/2017  ? HEPBSAB REACTIVE (A) 04/09/2017  ? ?No results found for: HCVAB ?Lab Results  ?Component Value Date  ? CHLAMYDIAWP Negative 04/09/2017  ? N Negative 04/09/2017  ? ?  ?Problem List Items Addressed This Visit   ? ?  ? Unprioritized  ? HIV (human immunodeficiency virus infection) ([place]) - Primary (Chronic)  ?  Well controlled on Tivicay + Prezcobix taken together once a day. Will provide 1 year refills today.  ?No new medications to review. Continues on reduced dose apixaban for DVT history with concurrent cobicistat medication.  ?Will update BMP, LFTs, VL, CBC and CD4 today. Routine RPR screen since it has been a few years.  ?He can come back for regular follow up anywhere 9-12 months. ?  ?  ? Relevant Medications  ? dolutegravir (TIVICAY) 50 MG tablet  ? darunavir-cobicistat (PREZCOBIX) 800-150 MG tablet  ? Other Relevant Orders  ? HIV-1 RNA quant-no reflex-bld  ? COMPLETE METABOLIC PANEL WITH GFR (Completed)  ? CBC with Differential/Platelet (Completed)  ? T-helper cells (CD4) count  ? Adjustment reaction with mixed emotional features  ?  We spent a majority of time discussing details here today. We talked about next steps for counseling - I am happy to provide a referral for Sean Conrad should he need one but I encouraged him to choose an accessible provider and start with an interview.   ?  ?  ? Creatinine elevation  ?  Follow repeated lab draw today.  ?  ?  ? ? ? ?Janene Madeira, MSN, NP-C ?[place] for Infectious Disease ?[facility] ?Pager: [phone] ? ? ? ?

## 2021-11-04 NOTE — Patient Instructions (Signed)
Please let me know if you need a referral for the provider we chatted about today.  ? ?Refills have been sent in for you.  ? ?Happy to see you back in 9-12 months.  ?

## 2021-11-05 LAB — T-HELPER CELLS (CD4) COUNT (NOT AT ARMC)
CD4 % Helper T Cell: 27 % — ABNORMAL LOW (ref 33–65)
CD4 T Cell Abs: 1121 /uL (ref 400–1790)

## 2021-11-05 NOTE — Assessment & Plan Note (Signed)
We spent a majority of time discussing details here today. We talked about next steps for counseling - I am happy to provide a referral for Sean Conrad should he need one but I encouraged him to choose an accessible provider and start with an interview.   ?

## 2021-11-05 NOTE — Assessment & Plan Note (Signed)
Well controlled on Tivicay + Prezcobix taken together once a day. Will provide 1 year refills today.  ?No new medications to review. Continues on reduced dose apixaban for DVT history with concurrent cobicistat medication.  ?Will update BMP, LFTs, VL, CBC and CD4 today. Routine RPR screen since it has been a few years.  ?He can come back for regular follow up anywhere 9-12 months. ?

## 2021-11-05 NOTE — Assessment & Plan Note (Signed)
Follow repeated lab draw today.  ?

## 2021-11-06 LAB — COMPLETE METABOLIC PANEL WITH GFR
AG Ratio: 1.5 (calc) (ref 1.0–2.5)
ALT: 27 U/L (ref 9–46)
AST: 32 U/L (ref 10–35)
Albumin: 4.2 g/dL (ref 3.6–5.1)
Alkaline phosphatase (APISO): 77 U/L (ref 35–144)
BUN: 15 mg/dL (ref 7–25)
CO2: 31 mmol/L (ref 20–32)
Calcium: 9.9 mg/dL (ref 8.6–10.3)
Chloride: 105 mmol/L (ref 98–110)
Creat: 1.17 mg/dL (ref 0.70–1.35)
Globulin: 2.8 g/dL (calc) (ref 1.9–3.7)
Glucose, Bld: 87 mg/dL (ref 65–99)
Potassium: 4.7 mmol/L (ref 3.5–5.3)
Sodium: 141 mmol/L (ref 135–146)
Total Bilirubin: 0.5 mg/dL (ref 0.2–1.2)
Total Protein: 7 g/dL (ref 6.1–8.1)
eGFR: 70 mL/min/{1.73_m2} (ref >=60)

## 2021-11-06 LAB — CBC WITH DIFFERENTIAL/PLATELET
Absolute Monocytes: 350 cells/uL (ref 200–950)
Basophils Absolute: 13 cells/uL (ref 0–200)
Basophils Relative: 0.2 %
Eosinophils Absolute: 73 cells/uL (ref 15–500)
Eosinophils Relative: 1.1 %
HCT: 44.1 % (ref 38.5–50.0)
Hemoglobin: 14.8 g/dL (ref 13.2–17.1)
Lymphs Abs: 4851 cells/uL — ABNORMAL HIGH (ref 850–3900)
MCH: 32.5 pg (ref 27.0–33.0)
MCHC: 33.6 g/dL (ref 32.0–36.0)
MCV: 96.9 fL (ref 80.0–100.0)
MPV: 10.1 fL (ref 7.5–12.5)
Monocytes Relative: 5.3 %
Neutro Abs: 1313 cells/uL — ABNORMAL LOW (ref 1500–7800)
Neutrophils Relative %: 19.9 %
Platelets: 198 10*3/uL (ref 140–400)
RBC: 4.55 10*6/uL (ref 4.20–5.80)
RDW: 12.9 % (ref 11.0–15.0)
Total Lymphocyte: 73.5 %
WBC: 6.6 10*3/uL (ref 3.8–10.8)

## 2021-11-06 LAB — HIV-1 RNA QUANT-NO REFLEX-BLD
HIV 1 RNA Quant: NOT DETECTED copies/mL
HIV-1 RNA Quant, Log: NOT DETECTED Log copies/mL

## 2022-07-23 DIAGNOSIS — E7800 Pure hypercholesterolemia, unspecified: Secondary | ICD-10-CM | POA: Diagnosis not present

## 2022-07-23 DIAGNOSIS — Z1211 Encounter for screening for malignant neoplasm of colon: Secondary | ICD-10-CM | POA: Diagnosis not present

## 2022-07-23 DIAGNOSIS — Z125 Encounter for screening for malignant neoplasm of prostate: Secondary | ICD-10-CM | POA: Diagnosis not present

## 2022-07-29 ENCOUNTER — Other Ambulatory Visit: Payer: Self-pay | Admitting: Internal Medicine

## 2022-07-29 DIAGNOSIS — Z0000 Encounter for general adult medical examination without abnormal findings: Secondary | ICD-10-CM

## 2022-07-29 DIAGNOSIS — R82998 Other abnormal findings in urine: Secondary | ICD-10-CM | POA: Diagnosis not present

## 2022-07-29 DIAGNOSIS — Z1331 Encounter for screening for depression: Secondary | ICD-10-CM | POA: Diagnosis not present

## 2022-07-29 DIAGNOSIS — Z1339 Encounter for screening examination for other mental health and behavioral disorders: Secondary | ICD-10-CM | POA: Diagnosis not present

## 2022-07-29 DIAGNOSIS — B20 Human immunodeficiency virus [HIV] disease: Secondary | ICD-10-CM | POA: Diagnosis not present

## 2022-08-25 ENCOUNTER — Other Ambulatory Visit: Payer: Federal, State, Local not specified - PPO

## 2022-08-25 ENCOUNTER — Other Ambulatory Visit: Payer: Self-pay

## 2022-08-25 ENCOUNTER — Other Ambulatory Visit (HOSPITAL_COMMUNITY)
Admission: RE | Admit: 2022-08-25 | Discharge: 2022-08-25 | Disposition: A | Discharge status: Home / Self Care | Payer: Federal, State, Local not specified - PPO | Source: Ambulatory Visit | Attending: Infectious Diseases | Admitting: Infectious Diseases

## 2022-08-25 DIAGNOSIS — B20 Human immunodeficiency virus [HIV] disease: Secondary | ICD-10-CM | POA: Insufficient documentation

## 2022-08-25 DIAGNOSIS — Z79899 Other long term (current) drug therapy: Secondary | ICD-10-CM

## 2022-08-25 DIAGNOSIS — Z21 Asymptomatic human immunodeficiency virus [HIV] infection status: Secondary | ICD-10-CM

## 2022-08-25 DIAGNOSIS — Z113 Encounter for screening for infections with a predominantly sexual mode of transmission: Secondary | ICD-10-CM | POA: Diagnosis not present

## 2022-08-26 ENCOUNTER — Ambulatory Visit
Admission: RE | Admit: 2022-08-26 | Discharge: 2022-08-26 | Disposition: A | Discharge status: Home / Self Care | Payer: Federal, State, Local not specified - PPO | Source: Ambulatory Visit | Attending: Internal Medicine | Admitting: Internal Medicine

## 2022-08-26 DIAGNOSIS — E785 Hyperlipidemia, unspecified: Secondary | ICD-10-CM | POA: Diagnosis not present

## 2022-08-26 DIAGNOSIS — Z0000 Encounter for general adult medical examination without abnormal findings: Secondary | ICD-10-CM

## 2022-08-26 LAB — T-HELPER CELL (CD4) - (RCID CLINIC ONLY)
CD4 % Helper T Cell: 26 % — ABNORMAL LOW (ref 33–65)
CD4 T Cell Abs: 993 /uL (ref 400–1790)

## 2022-08-26 LAB — URINE CYTOLOGY ANCILLARY ONLY
Chlamydia: NEGATIVE
Comment: NEGATIVE
Comment: NORMAL
Neisseria Gonorrhea: NEGATIVE

## 2022-08-27 LAB — CBC WITH DIFFERENTIAL/PLATELET
Absolute Monocytes: 384 cells/uL (ref 200–950)
Basophils Absolute: 20 cells/uL (ref 0–200)
Basophils Relative: 0.3 %
Eosinophils Absolute: 52 cells/uL (ref 15–500)
Eosinophils Relative: 0.8 %
HCT: 43 % (ref 38.5–50.0)
Hemoglobin: 15.2 g/dL (ref 13.2–17.1)
Lymphs Abs: 4388 cells/uL — ABNORMAL HIGH (ref 850–3900)
MCH: 33.6 pg — ABNORMAL HIGH (ref 27.0–33.0)
MCHC: 35.3 g/dL (ref 32.0–36.0)
MCV: 95.1 fL (ref 80.0–100.0)
MPV: 10.3 fL (ref 7.5–12.5)
Monocytes Relative: 5.9 %
Neutro Abs: 1658 cells/uL (ref 1500–7800)
Neutrophils Relative %: 25.5 %
Platelets: 200 10*3/uL (ref 140–400)
RBC: 4.52 10*6/uL (ref 4.20–5.80)
RDW: 12.8 % (ref 11.0–15.0)
Total Lymphocyte: 67.5 %
WBC: 6.5 10*3/uL (ref 3.8–10.8)

## 2022-08-27 LAB — LIPID PANEL
Cholesterol: 247 mg/dL — ABNORMAL HIGH (ref <200)
HDL: 64 mg/dL (ref >=40)
LDL Cholesterol (Calc): 159 mg/dL (calc) — ABNORMAL HIGH
Non-HDL Cholesterol (Calc): 183 mg/dL (calc) — ABNORMAL HIGH (ref <130)
Total CHOL/HDL Ratio: 3.9 (calc) (ref <5.0)
Triglycerides: 121 mg/dL (ref <150)

## 2022-08-27 LAB — COMPLETE METABOLIC PANEL WITH GFR
AG Ratio: 1.8 (calc) (ref 1.0–2.5)
ALT: 22 U/L (ref 9–46)
AST: 23 U/L (ref 10–35)
Albumin: 4.4 g/dL (ref 3.6–5.1)
Alkaline phosphatase (APISO): 68 U/L (ref 35–144)
BUN: 14 mg/dL (ref 7–25)
CO2: 27 mmol/L (ref 20–32)
Calcium: 9.6 mg/dL (ref 8.6–10.3)
Chloride: 108 mmol/L (ref 98–110)
Creat: 1.15 mg/dL (ref 0.70–1.35)
Globulin: 2.5 g/dL (calc) (ref 1.9–3.7)
Glucose, Bld: 96 mg/dL (ref 65–99)
Potassium: 4.6 mmol/L (ref 3.5–5.3)
Sodium: 143 mmol/L (ref 135–146)
Total Bilirubin: 0.4 mg/dL (ref 0.2–1.2)
Total Protein: 6.9 g/dL (ref 6.1–8.1)
eGFR: 71 mL/min/{1.73_m2} (ref >=60)

## 2022-08-27 LAB — HIV-1 RNA QUANT-NO REFLEX-BLD
HIV 1 RNA Quant: <20 Copies/mL — ABNORMAL HIGH
HIV-1 RNA Quant, Log: <1.3 Log cps/mL — ABNORMAL HIGH

## 2022-08-27 LAB — RPR: RPR Ser Ql: NONREACTIVE

## 2022-09-08 ENCOUNTER — Other Ambulatory Visit: Payer: Self-pay

## 2022-09-08 ENCOUNTER — Ambulatory Visit: Payer: Federal, State, Local not specified - PPO | Admitting: Infectious Diseases

## 2022-09-08 ENCOUNTER — Encounter: Payer: Self-pay | Admitting: Infectious Diseases

## 2022-09-08 VITALS — BP 129/83 | HR 80 | Resp 16 | Ht 66.0 in | Wt 179.0 lb

## 2022-09-08 DIAGNOSIS — D229 Melanocytic nevi, unspecified: Secondary | ICD-10-CM | POA: Diagnosis not present

## 2022-09-08 DIAGNOSIS — K4090 Unilateral inguinal hernia, without obstruction or gangrene, not specified as recurrent: Secondary | ICD-10-CM | POA: Insufficient documentation

## 2022-09-08 DIAGNOSIS — Z86718 Personal history of other venous thrombosis and embolism: Secondary | ICD-10-CM | POA: Diagnosis not present

## 2022-09-08 DIAGNOSIS — B20 Human immunodeficiency virus [HIV] disease: Secondary | ICD-10-CM

## 2022-09-08 DIAGNOSIS — Z0000 Encounter for general adult medical examination without abnormal findings: Secondary | ICD-10-CM

## 2022-09-08 MED ORDER — PREZCOBIX 800-150 MG PO TABS: 1.0000 | ORAL_TABLET | Freq: Every day | ORAL | 3 refills | Status: DC

## 2022-09-08 MED ORDER — TIVICAY 50 MG PO TABS: 50.0000 mg | ORAL_TABLET | Freq: Every day | ORAL | 3 refills | Status: DC

## 2022-09-08 MED ORDER — PITAVASTATIN CALCIUM 4 MG PO TABS: 4.0000 mg | ORAL_TABLET | Freq: Every day | ORAL | 5 refills | Status: DC

## 2022-09-08 NOTE — Assessment & Plan Note (Signed)
Skin mole to the outer canthus of left eye. Not sure whether cryo is doable in the area given proximity to eye vs shave vs alternative tx. Will start with dermatology referral. May need plastic surgery to help with the area. Referral to derm placed today.

## 2022-09-08 NOTE — Patient Instructions (Addendum)
Nice to see you as always!   For Primary Care I recommend you see if you can get an appointment with Dr. Pricilla Holm at [facility].  Address: [address], [place], [place] [address] Phone: [phone]  Referrals sent in for dermatology and surgery - they should contact you. If you don't hear from them in 1-2 weeks please send me a message on MyChart so we can look into it for you.   Would like to have you back in 2 months for a lab visit to make sure you liver is looking good on the new cholesterol medicine.

## 2022-09-08 NOTE — Progress Notes (Signed)
Name: Sean Conrad  DOB: 04/17/1956 MRN: [id number] PCP: Renaldo Fiddler ([place])   Subjective:  Brief ID:  Sean Conrad transferred care in August 2018 from [place] area (Dr. Ramond Dial MD). Retired since 2017. Dx Feb 1995 with history of Shingles   Previous Medications:  Truvada, AZT, Stribild and others he cannot recall Prezcobix + Tivicay --> suppressed   Genotype:  "many" per his report. None were sent with transfer documentation.     Chief Complaint  Patient presents with   Follow-up    B20-Discuss Vaccines       HPI/ROS:  Sean Conrad is here for routine follow up care. He has been doing well on Prezcobix + Tivicay taken together daily. Needs refills, prefers 90d fills if possible.   Has a few concerns today -  He has noticed a lump that feels like a "cord" in the lower right abdomen. He can tell when he has to have a BM as it feels different in the RLQ. He had surgery to repair a hernia on the left many years ago. Not painful.   Kidney stones passed recently. Has had these in the past over years and previously was working with urology, requiring various procedures including lithotripsy. He prefers to have flomax available to help pass stones (old prescription).   Left eye mole noticed it is getting increased size and becoming irritated. Has been present for some time now but growing in size recently. Very close to his eye lid. No bleeding.   Had RSV vaccine, COVID booster and Flu Vaccines.    Review of Systems  Constitutional:  Negative for chills, fever and weight loss.  Respiratory: Negative.    Cardiovascular: Negative.   Gastrointestinal:  Negative for abdominal pain, blood in stool, constipation, diarrhea and vomiting.  Genitourinary:        Thinks he passed a stone recently.   Musculoskeletal: Negative.   Skin:        Change in mole on face near left eye  Neurological:  Negative for dizziness and headaches.  Psychiatric/Behavioral:   Negative for depression.      Past Medical History:  Diagnosis Date   Bell's palsy 2015   Colon polyps    DVT (deep venous thrombosis) (HCC) 1992   Right calf   Elevated serum creatinine    Erectile dysfunction    HIV infection ([place])    Hyperlipidemia    Kidney stones    Pneumonia     Social History   Tobacco Use   Smoking status: Every Day    Packs/day: 0.10    Types: Cigarettes    Passive exposure: Past   Smokeless tobacco: Never   Tobacco comments:    trying to quit, goes through a pack in about a week  Vaping Use   Vaping Use: Never used  Substance Use Topics   Alcohol use: Yes    Alcohol/week: 2.0 standard drinks of alcohol    Types: 1 Glasses of wine, 1 Shots of liquor per week    Comment: occasionally   Drug use: No     Allergies  Allergen Reactions   Bactrim [Sulfamethoxazole-Trimethoprim] Rash    OBJECTIVE: Vitals:   09/08/22 1021  BP: 129/83  Pulse: 80  Resp: 16  SpO2: 97%  Weight: 179 lb (81.2 kg)  Height: 5' 6"$  (1.676 m)   Body mass index is 28.89 kg/m.  Physical Exam Vitals reviewed.  Constitutional:      Appearance: Normal appearance. He is not ill-appearing.  HENT:     Mouth/Throat:     Mouth: Mucous membranes are moist.     Pharynx: No oropharyngeal exudate.  Cardiovascular:     Rate and Rhythm: Normal rate.     Heart sounds: No murmur heard. Abdominal:     General: Bowel sounds are normal. There is no distension.     Palpations: Abdomen is soft.     Hernia: A hernia (right inguinal palpated with bearing down. Reducible. Non tender.) is present.  Skin:    General: Skin is warm and dry.  Neurological:     Mental Status: He is alert.     Lab Results Lab Results  Component Value Date   WBC 6.5 08/25/2022   HGB 15.2 08/25/2022   HCT 43.0 08/25/2022   MCV 95.1 08/25/2022   PLT 200 08/25/2022    Lab Results  Component Value Date   CREATININE 1.15 08/25/2022   BUN 14 08/25/2022   NA 143 08/25/2022   K 4.6 08/25/2022    CL 108 08/25/2022   CO2 27 08/25/2022    Lab Results  Component Value Date   ALT 22 08/25/2022   AST 23 08/25/2022   ALKPHOS 84 08/03/2018   BILITOT 0.4 08/25/2022    Lab Results  Component Value Date   CHOL 247 (H) 08/25/2022   HDL 64 08/25/2022   LDLCALC 159 (H) 08/25/2022   TRIG 121 08/25/2022   CHOLHDL 3.9 08/25/2022   Lab Results  Component Value Date   HEPBSAG NON-REACTIVE 04/09/2017   HEPBSAB REACTIVE (A) 04/09/2017   No results found for: "HCVAB" Lab Results  Component Value Date   CHLAMYDIAWP Negative 08/25/2022   N Negative 08/25/2022     Problem List Items Addressed This Visit       Unprioritized   HIV (human immunodeficiency virus infection) ([place]) - Primary (Chronic)    Very well controlled on once daily Tivicay + Prezcobix taken together daily with food. No concerns with access or adherence to medication. They are tolerating the medication well without side effects. No drug interactions identified. Pertinent lab tests ordered today and reviewed with Mr. Petrosyan. Refills provided 90d to specialty pharmacy.  No changes to insurance coverage.  No dental needs today.  No concern over anxious/depressed mood.  Vaccines updated today - see health maintenance section.   Return in about 2 months (around 11/07/2022) for labs.  FU in the office in 6-9 months per his comfort and preference.        Relevant Medications   dolutegravir (TIVICAY) 50 MG tablet   darunavir-cobicistat (PREZCOBIX) 800-150 MG tablet   Healthcare maintenance    Completed RSV, seasonal flu and COVID booster recently.       History of DVT (deep vein thrombosis)    Continues on reduced dose Eliquis d/t drug interaction with Prezcobix.       Change in skin mole    Skin mole to the outer canthus of left eye. Not sure whether cryo is doable in the area given proximity to eye vs shave vs alternative tx. Will start with dermatology referral. May need plastic surgery to help with the  area. Referral to derm placed today.       Relevant Orders   Ambulatory referral to Dermatology   Unilateral inguinal hernia without obstruction or gangrene    Palpable hernia in right inguinal area on exam w/o signs of obstruction. Non-tender.  We discussed referral to surgery team to help with monitoring and recommended surgical interventions.  Referral  placed for him to see Dr. Rosendo Gros at [facility].       Relevant Orders   Ambulatory referral to [place], MSN, NP-C [facility] Pager: [phone]

## 2022-09-08 NOTE — Assessment & Plan Note (Signed)
Very well controlled on once daily Tivicay + Prezcobix taken together daily with food. No concerns with access or adherence to medication. They are tolerating the medication well without side effects. No drug interactions identified. Pertinent lab tests ordered today and reviewed with Sean Conrad. Refills provided 90d to specialty pharmacy.  No changes to insurance coverage.  No dental needs today.  No concern over anxious/depressed mood.  Vaccines updated today - see health maintenance section.   Return in about 2 months (around 11/07/2022) for labs.  FU in the office in 6-9 months per his comfort and preference.

## 2022-09-08 NOTE — Assessment & Plan Note (Signed)
Continues on reduced dose Eliquis d/t drug interaction with Prezcobix.

## 2022-09-08 NOTE — Assessment & Plan Note (Signed)
Completed RSV, seasonal flu and COVID booster recently.

## 2022-09-08 NOTE — Assessment & Plan Note (Signed)
Palpable hernia in right inguinal area on exam w/o signs of obstruction. Non-tender.  We discussed referral to surgery team to help with monitoring and recommended surgical interventions.  Referral placed for him to see Dr. Rosendo Gros at [facility].

## 2022-10-06 DIAGNOSIS — K4090 Unilateral inguinal hernia, without obstruction or gangrene, not specified as recurrent: Secondary | ICD-10-CM | POA: Diagnosis not present

## 2022-10-27 ENCOUNTER — Telehealth: Payer: Self-pay

## 2022-10-27 NOTE — Telephone Encounter (Signed)
Patient called office stating he is having issues getting Pitvastatin filled at [facility]. Was told insurance would not cover prescription since he filled it last on 3/11. Patient will be on vacation for two weeks. Only has three days off medication. Spoke with pharmacist who states after running prescription through insurance he can fill on 4/3. Will send mychart message requesting he call pharmacy again on 4/3. Leatrice Jewels, RMA

## 2022-10-29 DIAGNOSIS — K029 Dental caries, unspecified: Secondary | ICD-10-CM | POA: Diagnosis not present

## 2022-11-17 DIAGNOSIS — K029 Dental caries, unspecified: Secondary | ICD-10-CM | POA: Diagnosis not present

## 2022-12-04 ENCOUNTER — Other Ambulatory Visit: Payer: Federal, State, Local not specified - PPO

## 2022-12-04 ENCOUNTER — Other Ambulatory Visit: Payer: Self-pay | Admitting: Infectious Diseases

## 2022-12-04 ENCOUNTER — Ambulatory Visit: Payer: Federal, State, Local not specified - PPO | Admitting: Infectious Diseases

## 2022-12-04 ENCOUNTER — Other Ambulatory Visit: Payer: Self-pay

## 2022-12-04 DIAGNOSIS — Z79899 Other long term (current) drug therapy: Secondary | ICD-10-CM

## 2022-12-04 DIAGNOSIS — E785 Hyperlipidemia, unspecified: Secondary | ICD-10-CM

## 2022-12-05 LAB — HEPATIC FUNCTION PANEL
AG Ratio: 1.5 (calc) (ref 1.0–2.5)
ALT: 21 U/L (ref 9–46)
AST: 30 U/L (ref 10–35)
Albumin: 4.2 g/dL (ref 3.6–5.1)
Alkaline phosphatase (APISO): 68 U/L (ref 35–144)
Bilirubin, Direct: 0.2 mg/dL (ref 0.0–0.2)
Globulin: 2.8 g/dL (calc) (ref 1.9–3.7)
Indirect Bilirubin: 0.5 mg/dL (calc) (ref 0.2–1.2)
Total Bilirubin: 0.7 mg/dL (ref 0.2–1.2)
Total Protein: 7 g/dL (ref 6.1–8.1)

## 2022-12-05 LAB — LIPID PANEL
Cholesterol: 121 mg/dL (ref <200)
HDL: 60 mg/dL (ref >=40)
LDL Cholesterol (Calc): 39 mg/dL (calc)
Non-HDL Cholesterol (Calc): 61 mg/dL (calc) (ref <130)
Total CHOL/HDL Ratio: 2 (calc) (ref <5.0)
Triglycerides: 136 mg/dL (ref <150)

## 2022-12-19 DIAGNOSIS — L821 Other seborrheic keratosis: Secondary | ICD-10-CM | POA: Diagnosis not present

## 2022-12-30 DIAGNOSIS — L538 Other specified erythematous conditions: Secondary | ICD-10-CM | POA: Diagnosis not present

## 2022-12-30 DIAGNOSIS — D2239 Melanocytic nevi of other parts of face: Secondary | ICD-10-CM | POA: Diagnosis not present

## 2023-02-21 ENCOUNTER — Other Ambulatory Visit: Payer: Self-pay | Admitting: Infectious Diseases

## 2023-02-23 NOTE — Telephone Encounter (Signed)
Please advise on refill.

## 2023-02-24 DIAGNOSIS — K08499 Partial loss of teeth due to other specified cause, unspecified class: Secondary | ICD-10-CM | POA: Diagnosis not present

## 2023-03-26 ENCOUNTER — Encounter: Payer: Self-pay | Admitting: Infectious Diseases

## 2023-03-26 ENCOUNTER — Ambulatory Visit: Payer: Federal, State, Local not specified - PPO | Admitting: Infectious Diseases

## 2023-03-26 ENCOUNTER — Other Ambulatory Visit (HOSPITAL_COMMUNITY)
Admission: RE | Admit: 2023-03-26 | Discharge: 2023-03-26 | Disposition: A | Discharge status: Home / Self Care | Payer: Federal, State, Local not specified - PPO | Source: Ambulatory Visit | Attending: Infectious Diseases | Admitting: Infectious Diseases

## 2023-03-26 ENCOUNTER — Other Ambulatory Visit: Payer: Self-pay

## 2023-03-26 VITALS — BP 102/71 | HR 76 | Resp 16 | Ht 66.0 in | Wt 174.0 lb

## 2023-03-26 DIAGNOSIS — E881 Lipodystrophy, not elsewhere classified: Secondary | ICD-10-CM | POA: Diagnosis not present

## 2023-03-26 DIAGNOSIS — Z1151 Encounter for screening for human papillomavirus (HPV): Secondary | ICD-10-CM | POA: Diagnosis not present

## 2023-03-26 DIAGNOSIS — B20 Human immunodeficiency virus [HIV] disease: Secondary | ICD-10-CM | POA: Diagnosis not present

## 2023-03-26 DIAGNOSIS — Z21 Asymptomatic human immunodeficiency virus [HIV] infection status: Secondary | ICD-10-CM | POA: Insufficient documentation

## 2023-03-26 DIAGNOSIS — T375X5A Adverse effect of antiviral drugs, initial encounter: Secondary | ICD-10-CM | POA: Diagnosis not present

## 2023-03-26 DIAGNOSIS — Z1212 Encounter for screening for malignant neoplasm of rectum: Secondary | ICD-10-CM | POA: Diagnosis not present

## 2023-03-26 NOTE — Patient Instructions (Signed)
Always a pleasure!  Will plan a 9 month visit.

## 2023-03-26 NOTE — Assessment & Plan Note (Signed)
Very well controlled on once daily Prezcobix + Tivicay taken together with food everyday. No concerns with access or adherence to medication. They are tolerating the medication well without side effects. No drug interactions identified - eliquis has been dose reduced d/t coadministration with cobicistat.   Pertinent lab tests ordered today.  No changes to insurance coverage.  No dental needs today.  No concern over anxious/depressed mood.  Sexual health and family planning discussed - anal cancer screening discussed - self collected today.  Vaccines updated today - see health maintenance section. He has hepatitis B immunity and does not need another booster dose.   Follow up in 9 months

## 2023-03-26 NOTE — Assessment & Plan Note (Signed)
Dermal fillers may be helpful for him given thinning around the cheek area. Will provide recommendations for a consultation so he can get more information on procedure, products and possible associated risks.

## 2023-03-26 NOTE — Assessment & Plan Note (Signed)
Discussed recommendations for anal cancer screenings related to anal pap smear.  Further recommendations to follow.

## 2023-03-26 NOTE — Progress Notes (Signed)
Name: Sean Conrad  DOB: [birth year] MRN: [id number] PCP: Edison Simon [facility])   Subjective:  Brief ID:  Sean Conrad transferred care in August 2018 from [place] area (Dr. Olive Bass MD). Retired since 2017. Dx Feb 1995 with history of Shingles   Previous Medications:  Truvada, AZT, Stribild and others he cannot recall Prezcobix + Tivicay --> suppressed   Genotype:  "many" per his report. None were sent with transfer documentation.     Chief Complaint  Patient presents with   Follow-up      HPI/ROS:  Sean Conrad is here for routine follow up care. He has been doing well on Prezcobix + Tivicay taken together daily. No missed doses, trouble with access or any concern for side effects. No new medications since LOV.   Started pitavastatin for primary cardiac prevention earlier this year - doing well on this.  Met with Dr. Derrell Lolling in March this year for inguinal hernia. Recommended monitoring vs laparoscopic mesh repair. Would like to get his information to follow back on this - nothing has changed in the quality of the hernia.   Oral surgery for dental implants coming up soon. 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    Review of Systems  Constitutional:  Negative for chills and fever.  HENT:  Negative for tinnitus.   Eyes:  Negative for blurred vision and photophobia.  Respiratory:  Negative for cough and sputum production.   Cardiovascular:  Negative for chest pain.  Gastrointestinal:  Negative for diarrhea, nausea and vomiting.  Genitourinary:  Negative for dysuria.  Skin:  Negative for rash.  Neurological:  Negative for headaches.     Past Medical History:  Diagnosis Date   Bell's palsy 2015   Colon polyps    DVT (deep venous thrombosis) (HCC) 1992   Right calf   Elevated serum creatinine    Erectile dysfunction    HIV infection (HCC)    Hyperlipidemia    Kidney stones    Pneumonia      Social History   Tobacco Use   Smoking status: Every Day    Current packs/day: 0.10    Types: Cigarettes    Passive exposure: Past   Smokeless tobacco: Never   Tobacco comments:    trying to quit, goes through a pack in about a week  Vaping Use   Vaping status: Never Used  Substance Use Topics   Alcohol use: Yes    Alcohol/week: 2.0 standard drinks of alcohol    Types: 1 Glasses of wine, 1 Shots of liquor per week    Comment: occasionally   Drug use: No     Allergies  Allergen Reactions   Bactrim [Sulfamethoxazole-Trimethoprim] Rash    OBJECTIVE: Vitals:   03/26/23 0837  BP: 102/71  Pulse: 76  Resp: 16  SpO2: 98%  Weight: 174 lb (78.9 kg)  Height: 5\' 6"  (1.676 m)   Body mass index is 28.08 kg/m.  Physical Exam Constitutional:      Appearance: Normal appearance. He is not ill-appearing.  HENT:     Head: Normocephalic.     Mouth/Throat:     Mouth: Mucous membranes are moist.     Pharynx: Oropharynx is clear.  Eyes:     General: No scleral icterus. Cardiovascular:     Rate and Rhythm: Normal rate and regular rhythm.  Pulmonary:     Effort: Pulmonary effort is normal.  Musculoskeletal:        General:  Normal range of motion.     Cervical back: Normal range of motion.  Skin:    Coloration: Skin is not jaundiced or pale.  Neurological:     Mental Status: He is alert and oriented to person, place, and time.  Psychiatric:        Mood and Affect: Mood normal.        Judgment: Judgment normal.     Lab Results Lab Results  Component Value Date   WBC 6.5 08/25/2022   HGB 15.2 08/25/2022   HCT 43.0 08/25/2022   MCV 95.1 08/25/2022   PLT 200 08/25/2022    Lab Results  Component Value Date   CREATININE 1.15 08/25/2022   BUN 14 08/25/2022   NA 143 08/25/2022   K 4.6 08/25/2022   CL 108 08/25/2022   CO2 27 08/25/2022    Lab Results  Component Value Date   ALT 21 12/04/2022   AST 30 12/04/2022   ALKPHOS 84 08/03/2018   BILITOT 0.7  12/04/2022    Lab Results  Component Value Date   CHOL 121 12/04/2022   HDL 60 12/04/2022   LDLCALC 39 12/04/2022   TRIG 136 12/04/2022   CHOLHDL 2.0 12/04/2022   Lab Results  Component Value Date   HEPBSAG NON-REACTIVE 04/09/2017   HEPBSAB REACTIVE (A) 04/09/2017   No results found for: "HCVAB" Lab Results  Component Value Date   CHLAMYDIAWP Negative 08/25/2022   N Negative 08/25/2022     Problem List Items Addressed This Visit       Unprioritized   HIV (human immunodeficiency virus infection) (HCC) - Primary (Chronic)    Very well controlled on once daily Prezcobix + Tivicay taken together with food everyday. No concerns with access or adherence to medication. They are tolerating the medication well without side effects. No drug interactions identified - eliquis has been dose reduced d/t coadministration with cobicistat.   Pertinent lab tests ordered today.  No changes to insurance coverage.  No dental needs today.  No concern over anxious/depressed mood.  Sexual health and family planning discussed - anal cancer screening discussed - self collected today.  Vaccines updated today - see health maintenance section. He has hepatitis B immunity and does not need another booster dose.   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 Will provide recommendations for a consultation so he can get more information on procedure, products and possible associated risks.       Screening for rectal cancer    Discussed recommendations for anal cancer screenings related to anal pap smear.  Further recommendations to follow.       Relevant Orders   Cytology - PAP     Rexene Alberts, MSN, NP-C [facility] Infectious [facility] Pager: [phone]

## 2023-03-27 LAB — T-HELPER CELLS (CD4) COUNT (NOT AT ARMC)
CD4 % Helper T Cell: 26 % — ABNORMAL LOW (ref 33–65)
CD4 T Cell Abs: 994 /uL (ref 400–1790)

## 2023-03-28 LAB — HIV-1 RNA QUANT-NO REFLEX-BLD
HIV 1 RNA Quant: NOT DETECTED {copies}/mL
HIV-1 RNA Quant, Log: NOT DETECTED {Log_copies}/mL

## 2023-04-02 LAB — CYTOLOGY - PAP
Comment: NEGATIVE
Diagnosis: NEGATIVE
High risk HPV: NEGATIVE

## 2023-04-14 ENCOUNTER — Encounter: Payer: Self-pay | Admitting: Internal Medicine

## 2023-07-24 ENCOUNTER — Other Ambulatory Visit: Payer: Self-pay | Admitting: Infectious Diseases

## 2023-07-24 DIAGNOSIS — Z21 Asymptomatic human immunodeficiency virus [HIV] infection status: Secondary | ICD-10-CM

## 2023-09-15 ENCOUNTER — Encounter: Payer: Self-pay | Admitting: Gastroenterology

## 2023-10-16 ENCOUNTER — Ambulatory Visit: Payer: Federal, State, Local not specified - PPO | Admitting: Internal Medicine

## 2023-10-19 ENCOUNTER — Encounter: Payer: Self-pay | Admitting: Internal Medicine

## 2023-10-19 ENCOUNTER — Ambulatory Visit: Admitting: Internal Medicine

## 2023-10-19 VITALS — BP 120/76 | HR 88 | Temp 97.8°F | Ht 66.0 in | Wt 172.0 lb

## 2023-10-19 DIAGNOSIS — Z23 Encounter for immunization: Secondary | ICD-10-CM | POA: Diagnosis not present

## 2023-10-19 DIAGNOSIS — Z21 Asymptomatic human immunodeficiency virus [HIV] infection status: Secondary | ICD-10-CM

## 2023-10-19 DIAGNOSIS — Z0000 Encounter for general adult medical examination without abnormal findings: Secondary | ICD-10-CM | POA: Diagnosis not present

## 2023-10-19 DIAGNOSIS — Z860100 Personal history of colon polyps, unspecified: Secondary | ICD-10-CM

## 2023-10-19 DIAGNOSIS — I82401 Acute embolism and thrombosis of unspecified deep veins of right lower extremity: Secondary | ICD-10-CM | POA: Diagnosis not present

## 2023-10-19 DIAGNOSIS — Z86718 Personal history of other venous thrombosis and embolism: Secondary | ICD-10-CM | POA: Diagnosis not present

## 2023-10-19 DIAGNOSIS — Z720 Tobacco use: Secondary | ICD-10-CM

## 2023-10-19 LAB — COMPREHENSIVE METABOLIC PANEL
ALT: 29 U/L (ref 0–53)
AST: 36 U/L (ref 0–37)
Albumin: 4.3 g/dL (ref 3.5–5.2)
Alkaline Phosphatase: 59 U/L (ref 39–117)
BUN: 17 mg/dL (ref 6–23)
CO2: 29 meq/L (ref 19–32)
Calcium: 9.8 mg/dL (ref 8.4–10.5)
Chloride: 105 meq/L (ref 96–112)
Creatinine, Ser: 1.17 mg/dL (ref 0.40–1.50)
GFR: 65.53 mL/min (ref >60.00)
Glucose, Bld: 84 mg/dL (ref 70–99)
Potassium: 4.4 meq/L (ref 3.5–5.1)
Sodium: 140 meq/L (ref 135–145)
Total Bilirubin: 0.5 mg/dL (ref 0.2–1.2)
Total Protein: 7.2 g/dL (ref 6.0–8.3)

## 2023-10-19 LAB — CBC
HCT: 41.8 % (ref 39.0–52.0)
Hemoglobin: 14.1 g/dL (ref 13.0–17.0)
MCHC: 33.7 g/dL (ref 30.0–36.0)
MCV: 97.6 fl (ref 78.0–100.0)
Platelets: 185 10*3/uL (ref 150.0–400.0)
RBC: 4.29 Mil/uL (ref 4.22–5.81)
RDW: 13.8 % (ref 11.5–15.5)
WBC: 6.5 10*3/uL (ref 4.0–10.5)

## 2023-10-19 MED ORDER — APIXABAN 2.5 MG PO TABS: 2.5000 mg | ORAL_TABLET | Freq: Two times a day (BID) | ORAL | 3 refills | Status: AC

## 2023-10-19 NOTE — Patient Instructions (Signed)
 We have given you the pneumonia vaccine today. We will check the labs.

## 2023-10-19 NOTE — Progress Notes (Signed)
   Subjective:   Patient ID: Sean Conrad, male    DOB: [birth year], 68 y.o.   MRN: [id number]  HPI The patient is a 68 YO man coming in for new patient apt and no new concerns desires physical  PMH, FMH, social history reviewed and updated  Review of Systems  Constitutional: Negative.   HENT: Negative.    Eyes: Negative.   Respiratory:  Negative for cough, chest tightness and shortness of breath.   Cardiovascular:  Negative for chest pain, palpitations and leg swelling.  Gastrointestinal:  Negative for abdominal distention, abdominal pain, constipation, diarrhea, nausea and vomiting.  Musculoskeletal: Negative.   Skin: Negative.   Neurological: Negative.   Psychiatric/Behavioral: Negative.      Objective:  Physical Exam Constitutional:      Appearance: He is well-developed.  HENT:     Head: Normocephalic and atraumatic.  Cardiovascular:     Rate and Rhythm: Normal rate and regular rhythm.  Pulmonary:     Effort: Pulmonary effort is normal. No respiratory distress.     Breath sounds: Normal breath sounds. No wheezing or rales.  Abdominal:     General: Bowel sounds are normal. There is no distension.     Palpations: Abdomen is soft.     Tenderness: There is no abdominal tenderness. There is no rebound.  Musculoskeletal:     Cervical back: Normal range of motion.  Skin:    General: Skin is warm and dry.  Neurological:     Mental Status: He is alert and oriented to person, place, and time.     Coordination: Coordination normal.     Vitals:   10/19/23 1034  BP: 120/76  Pulse: 88  Temp: 97.8 F (36.6 C)  TempSrc: Temporal  SpO2: 98%  Weight: 172 lb (78 kg)  Height: 5\' 6"  (1.676 m)    Assessment & Plan:  Prevnar 20

## 2023-10-19 NOTE — Assessment & Plan Note (Signed)
 Flu shot up to date. Pneumonia given 20. Shingrix complete. Tetanus due. Colonoscopy due visit upcoming. pap smear up to date via ID. Counseled about sun safety and mole surveillance. Counseled about the dangers of distracted driving. Given 10 year screening recommendations.

## 2023-10-19 NOTE — Assessment & Plan Note (Signed)
 Taking eliquis 2.5 mg BID and will continue. Refilled and checking CMP and CBC.

## 2023-10-19 NOTE — Assessment & Plan Note (Signed)
 He has cut back and counseled about quitting. He will consider.

## 2023-10-19 NOTE — Assessment & Plan Note (Signed)
 Visit for colonoscopy upcoming.

## 2023-10-19 NOTE — Assessment & Plan Note (Signed)
 Stable and seeing ID for management last viral load undetected and CD4 count good.

## 2023-11-10 ENCOUNTER — Encounter: Payer: Self-pay | Admitting: Gastroenterology

## 2023-11-10 ENCOUNTER — Ambulatory Visit (INDEPENDENT_AMBULATORY_CARE_PROVIDER_SITE_OTHER): Admitting: Gastroenterology

## 2023-11-10 VITALS — BP 120/76 | HR 80 | Ht 66.0 in | Wt 169.0 lb

## 2023-11-10 DIAGNOSIS — Z7901 Long term (current) use of anticoagulants: Secondary | ICD-10-CM | POA: Diagnosis not present

## 2023-11-10 DIAGNOSIS — Z86718 Personal history of other venous thrombosis and embolism: Secondary | ICD-10-CM | POA: Diagnosis not present

## 2023-11-10 DIAGNOSIS — Z9189 Other specified personal risk factors, not elsewhere classified: Secondary | ICD-10-CM | POA: Diagnosis not present

## 2023-11-10 DIAGNOSIS — Z01818 Encounter for other preprocedural examination: Secondary | ICD-10-CM

## 2023-11-10 DIAGNOSIS — Z800 Family history of malignant neoplasm of digestive organs: Secondary | ICD-10-CM

## 2023-11-10 MED ORDER — NA SULFATE-K SULFATE-MG SULF 17.5-3.13-1.6 GM/177ML PO SOLN: 1.0000 | Freq: Once | ORAL | 0 refills | Status: DC

## 2023-11-10 MED ORDER — PLENVU 140 G PO SOLR: 1.0000 | Freq: Once | ORAL | 0 refills | Status: AC

## 2023-11-10 NOTE — Patient Instructions (Addendum)
 You will be contacted by our office prior to your procedure for directions on holding your Eliquis.  If you do not hear from our office 1 week prior to your scheduled procedure, please call [phone] to discuss.    You have been scheduled for a colonoscopy. Please follow written instructions given to you at your visit today.   If you use inhalers (even only as needed), please bring them with you on the day of your procedure.  DO NOT TAKE 7 DAYS PRIOR TO TEST- Trulicity (dulaglutide) Ozempic, Wegovy (semaglutide) Mounjaro (tirzepatide) Bydureon Bcise (exanatide extended release)  DO NOT TAKE 1 DAY PRIOR TO YOUR TEST Rybelsus (semaglutide) Adlyxin (lixisenatide) Victoza (liraglutide) Byetta (exanatide) _______________________________________________________________________  _______________________________________________________  If your blood pressure at your visit was 140/90 or greater, please contact your primary care physician to follow up on this.  _______________________________________________________  If you are age 66 or older, your body mass index should be between 23-30. Your Body mass index is 27.28 kg/m. If this is out of the aforementioned range listed, please consider follow up with your Primary Care Provider.  If you are age 41 or younger, your body mass index should be between 19-25. Your Body mass index is 27.28 kg/m. If this is out of the aformentioned range listed, please consider follow up with your Primary Care Provider.   ________________________________________________________  The [facility] providers would like to encourage you to use MYCHART to communicate with providers for non-urgent requests or questions.  Due to long hold times on the telephone, sending your provider a message by [facility] may be a faster and more efficient way to get a response.  Please allow 48 business hours for a response.  Please remember that this is for non-urgent requests.   _______________________________________________________

## 2023-11-10 NOTE — Progress Notes (Signed)
 11/10/2023 Sean Conrad [id number] May 12, 1956   HISTORY OF PRESENT ILLNESS: This is a pleasant 68 year old male who is a patient Dr. Elesa Hacker.  He has past medical history of DVTs on Eliquis (prescribed by PCP), HIV, hyperlipidemia, nephrolithiasis, family history of colon cancer in both mother and father.  He is here today to schedule colonoscopy.  Last was in February 2020 as below.  No GI complaints today.  Moves his bowels well.  Colonoscopy 08/2018:  - Non- thrombosed internal hemorrhoids found on digital rectal exam. - The examined portion of the ileum was normal. - Stool in the entire examined colon. - Normal mucosa in the entire examined colon. - Non- bleeding non- thrombosed internal hemorrhoids.   Past Medical History:  Diagnosis Date   Bell's palsy 2015   Colon polyps    DVT (deep venous thrombosis) (HCC) 1992   Right calf   Elevated serum creatinine    Erectile dysfunction    HIV infection (HCC)    Hyperlipidemia    Kidney stones    Pneumonia    Past Surgical History:  Procedure Laterality Date   COLONOSCOPY     INGUINAL HERNIA REPAIR Left    LITHOTRIPSY      reports that he has been smoking cigarettes. He has a 4 pack-year smoking history. He has been exposed to tobacco smoke. He has never used smokeless tobacco. He reports current alcohol use of about 2.0 standard drinks of alcohol per week. He reports that he does not use drugs. family history includes Cancer in his father and mother; Colon cancer in his father, mother, and paternal grandmother; Hypertension in his father. Allergies  Allergen Reactions   Bactrim [Sulfamethoxazole-Trimethoprim] Rash      Outpatient Encounter Medications as of 11/10/2023  Medication Sig   apixaban (ELIQUIS) 2.5 MG TABS tablet Take 1 tablet (2.5 mg total) by mouth 2 (two) times daily.   Cholecalciferol (VITAMIN D) 125 MCG (5000 UT) CAPS Take 1 tablet by mouth daily.   darunavir-cobicistat (PREZCOBIX) 800-150 MG tablet  Take 1 tablet by mouth daily. Swallow whole. Do NOT crush, break or chew tablets. Take with food.   dolutegravir (TIVICAY) 50 MG tablet Take 1 tablet (50 mg total) by mouth daily.   ELDERBERRY PO    Krill Oil 1000 MG CAPS Take by mouth.   Misc Natural Products (PROSTATE HEALTH) CAPS 1 capsule   Multiple Vitamins-Minerals (CENTRUM SILVER 50+MEN PO) Take by mouth.   Pitavastatin Calcium 4 MG TABS TAKE 1 TABLET BY MOUTH EVERY DAY   [DISCONTINUED] sildenafil (VIAGRA) 50 MG tablet Take 50-100 mg by mouth See admin instructions. 20 mins prior to 2/24 hrs not to exceed (Patient not taking: Reported on 11/10/2023)   No facility-administered encounter medications on file as of 11/10/2023.    REVIEW OF SYSTEMS  : All other systems reviewed and negative except where noted in the History of Present Illness.   PHYSICAL EXAM: BP 120/76   Pulse 80   Ht 5\' 6"  (1.676 m)   Wt 169 lb (76.7 kg)   SpO2 93%   BMI 27.28 kg/m  General: Well developed AA male in no acute distress Head: Normocephalic and atraumatic Eyes:  Sclerae anicteric, conjunctiva pink. Ears: Normal auditory acuity. Lungs: Clear throughout to auscultation; no W/R/R. Heart: Regular rate and rhythm; no M/R/G. Rectal:  Will be done at the time of colonoscopy. Musculoskeletal: Symmetrical with no gross deformities  Skin: No lesions on visible extremities Neurological: Alert oriented x 4, grossly non-focal  Psychological:  Alert and cooperative. Normal mood and affect  ASSESSMENT AND PLAN: *Family history of colon cancer in both his mother and his father: Has colonoscopies February 2020.  Due for 5-year recall.  Will schedule Dr. Brice Campi. 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 Benefits and risks of procedure explained including risks of bleeding, perforation, infection, missed lesions, reactions to  medications and possible need for hospitalization and surgery for complications. Additional rare but real risk of stroke or other vascular clotting events off of Eliquis also explained and need to seek urgent help if any signs of these problems occur. Will communicate by phone or EMR with patient's prescribing provider, Dr. Nicolette Barrio, to confirm that holding Elquis is reasonable in this case.     CC:  Tisovec, Kristina Pfeiffer, MD

## 2023-11-11 NOTE — Progress Notes (Signed)
 Attending Physician's Attestation   I have reviewed the chart.   I agree with the Advanced Practitioner's note, impression, and recommendations with any updates as below.    Corliss Parish, MD [facility] Advanced [facility]oscopy Office # [phone]

## 2023-11-27 ENCOUNTER — Telehealth: Payer: Self-pay | Admitting: *Deleted

## 2023-11-27 NOTE — Telephone Encounter (Signed)
  Sean Conrad 1956/02/01 [id number]  11/27/23   Dear Dr. Nicolette Barrio:  We have scheduled the above named patient for a(n) colonoscopy procedure. Our records show that (s)he is on anticoagulation therapy.  Please advise as to whether the patient may come off their therapy of Eliquis  2 days prior to their procedure which is scheduled for 12/16/2023.  Please route your response to Monita Anon, CMA or fax response to [phone].  Sincerely,   Monita Anon, [facility]

## 2023-11-28 ENCOUNTER — Other Ambulatory Visit: Payer: Self-pay | Admitting: Infectious Diseases

## 2023-11-28 DIAGNOSIS — Z21 Asymptomatic human immunodeficiency virus [HIV] infection status: Secondary | ICD-10-CM

## 2023-11-30 NOTE — Telephone Encounter (Signed)
Appt 5/6

## 2023-11-30 NOTE — Telephone Encounter (Signed)
 It would be okay to hold eliquis  2 days prior and resume as soon as safe afterwards. Dr. Nicolette Barrio

## 2023-11-30 NOTE — Telephone Encounter (Signed)
Patient informed he may hold Eliquis.

## 2023-12-01 ENCOUNTER — Ambulatory Visit (INDEPENDENT_AMBULATORY_CARE_PROVIDER_SITE_OTHER): Admitting: Infectious Diseases

## 2023-12-01 ENCOUNTER — Telehealth: Payer: Self-pay | Admitting: *Deleted

## 2023-12-01 ENCOUNTER — Encounter: Payer: Self-pay | Admitting: Infectious Diseases

## 2023-12-01 ENCOUNTER — Other Ambulatory Visit: Payer: Self-pay

## 2023-12-01 VITALS — BP 148/92 | HR 86 | Temp 98.0°F | Ht 66.0 in | Wt 171.0 lb

## 2023-12-01 DIAGNOSIS — Z9189 Other specified personal risk factors, not elsewhere classified: Secondary | ICD-10-CM

## 2023-12-01 DIAGNOSIS — G8929 Other chronic pain: Secondary | ICD-10-CM

## 2023-12-01 DIAGNOSIS — Z21 Asymptomatic human immunodeficiency virus [HIV] infection status: Secondary | ICD-10-CM

## 2023-12-01 DIAGNOSIS — Z800 Family history of malignant neoplasm of digestive organs: Secondary | ICD-10-CM

## 2023-12-01 DIAGNOSIS — Z7901 Long term (current) use of anticoagulants: Secondary | ICD-10-CM

## 2023-12-01 MED ORDER — TIVICAY 50 MG PO TABS
50.0000 mg | ORAL_TABLET | Freq: Every day | ORAL | 3 refills | Status: AC
Start: 2023-12-01 — End: ?

## 2023-12-01 MED ORDER — PITAVASTATIN CALCIUM 4 MG PO TABS: 1.0000 | ORAL_TABLET | Freq: Every day | ORAL | 3 refills | Status: AC

## 2023-12-01 MED ORDER — PREZCOBIX 800-150 MG PO TABS
1.0000 | ORAL_TABLET | Freq: Every day | ORAL | 3 refills | Status: AC
Start: 2023-12-01 — End: ?

## 2023-12-01 NOTE — Progress Notes (Signed)
 Name: Sean Conrad  DOB: [birth year] MRN: [id number] PCP: Janie Meier [facility])   Subjective:  Brief ID:  Sean Conrad transferred care in August 2018 from [place] area (Dr. Chick Cotton MD). Retired since 2017. Dx Feb 1995 with history of Shingles   Previous Medications:  Truvada, AZT, Stribild and others he cannot recall Prezcobix  + Tivicay  --> suppressed   Genotype:  "many" per his report. None were sent with transfer documentation.    Subjective   Chief Complaint  Patient presents with   Follow-up    Discussed the use of AI scribe software for clinical note transcription with the patient, who gave verbal consent to proceed.  History of Present Illness   Sean Conrad is a 68 year old male with HIV who presents for routine follow-up.  He has no new issues or complaints related to his HIV and expresses no concerns about his current management. Continues taking Tivicay  + Prezcobix  as previously prescribed and tolerating well. No problems getting from mail order pharmacy.   He experiences lower back pain on the left side, occasionally leading to leg weakness. The pain is not associated with shooting or burning sensations. He recalls a past diagnosis of a herniated disc and has had issues with kidney stones, which previously made it difficult to distinguish the source of his pain. Performing exercises improves his symptoms, although he sometimes lapses in maintaining his routine. He is currently using a spin bike and a treadmill for exercise.  He is on medication for cholesterol and takes Eliquis , which he manages with a weekly pill organizer. He recently encountered a price increase for Eliquis  but resolved it with his pharmacy.  He has a family history of colon cancer on both sides, necessitating colonoscopy screenings every five years. His next colonoscopy is scheduled for Dec 16, 2023.  He has received the pneumonia and shingles vaccines.           10/19/2023   10:41 AM 03/26/2023    8:38 AM 09/08/2022   10:19 AM  PHQ9 SCORE ONLY  PHQ-9 Total Score 0 0 0       No data to display           Review of Systems  Constitutional:  Negative for chills and fever.  HENT:  Negative for tinnitus.   Eyes:  Negative for blurred vision and photophobia.  Respiratory:  Negative for cough and sputum production.   Cardiovascular:  Negative for chest pain.  Gastrointestinal:  Negative for diarrhea, nausea and vomiting.  Genitourinary:  Negative for dysuria.  Skin:  Negative for rash.  Neurological:  Negative for headaches.     Past Medical History:  Diagnosis Date   Bell's palsy 2015   Colon polyps    DVT (deep venous thrombosis) (HCC) 1992   Right calf   Elevated serum creatinine    Erectile dysfunction    HIV infection (HCC)    Hyperlipidemia    Kidney stones    Pneumonia     Social History   Tobacco Use   Smoking status: Every Day    Current packs/day: 1.00    Average packs/day: 1 pack/day for 4.0 years (4.0 ttl pk-yrs)    Types: Cigarettes    Passive exposure: Past   Smokeless tobacco: Never   Tobacco comments:    trying to quit, goes through a pack in about a week  Vaping Use   Vaping status: Never Used  Substance Use Topics   Alcohol  use: Yes  Alcohol /week: 2.0 standard drinks of alcohol     Types: 1 Glasses of wine, 1 Shots of liquor per week    Comment: occasionally   Drug use: No     Allergies  Allergen Reactions   Bactrim [Sulfamethoxazole-Trimethoprim] Rash     Objective   OBJECTIVE: Vitals:   12/01/23 1252  BP: (!) 148/92  Pulse: 86  Temp: 98 F (36.7 C)  TempSrc: Temporal  SpO2: 98%  Weight: 171 lb (77.6 kg)  Height: 5\' 6"  (1.676 m)   Body mass index is 27.6 kg/m.  Physical Exam Constitutional:      Appearance: Normal appearance. He is not ill-appearing.  HENT:     Head: Normocephalic.     Mouth/Throat:     Mouth: Mucous membranes are moist.     Pharynx: Oropharynx is  clear.  Eyes:     General: No scleral icterus. Cardiovascular:     Rate and Rhythm: Normal rate.  Pulmonary:     Effort: Pulmonary effort is normal.  Musculoskeletal:        General: Normal range of motion.     Cervical back: Normal range of motion.  Skin:    Coloration: Skin is not jaundiced or pale.  Neurological:     Mental Status: He is alert and oriented to person, place, and time.  Psychiatric:        Mood and Affect: Mood normal.        Judgment: Judgment normal.     Lab Results Lab Results  Component Value Date   WBC 6.5 10/19/2023   HGB 14.1 10/19/2023   HCT 41.8 10/19/2023   MCV 97.6 10/19/2023   PLT 185.0 10/19/2023    Lab Results  Component Value Date   CREATININE 1.17 10/19/2023   BUN 17 10/19/2023   NA 140 10/19/2023   K 4.4 10/19/2023   CL 105 10/19/2023   CO2 29 10/19/2023    Lab Results  Component Value Date   ALT 29 10/19/2023   AST 36 10/19/2023   ALKPHOS 59 10/19/2023   BILITOT 0.5 10/19/2023    Lab Results  Component Value Date   CHOL 121 12/04/2022   HDL 60 12/04/2022   LDLCALC 39 12/04/2022   TRIG 136 12/04/2022   CHOLHDL 2.0 12/04/2022   Lab Results  Component Value Date   HEPBSAG NON-REACTIVE 04/09/2017   HEPBSAB REACTIVE (A) 04/09/2017       Assessment & Plan:  Assessment and Plan    HIV infection, Well Controlled -  VL < 20, CD4 > 500 -   HIV is well-controlled on once daily Tivicay  + Prezcobix  taken together with food. No problems with pharmacy fills. Refills sent to mail order pharmacy. Recent liver function tests and CBC were normal. Viral load and CD4 count due to update.  Anal pap in Aug 2024 was negative for HPV and had no abnormalities on cytology. After he has his colon cancer screening we will discuss utility of doing at least yearly screening x 3. Current guidelines do not remark on when to stop screening.  - Order viral load and CD4 count. - Discuss next steps for anal cancer screening at next appt (annually)   - Refills provided of pitavastatin , tivicay  and prezcobix .   Health Maintenance -  Prevnar 20 provided in March. Has had Shingrix vaccines as well.  Annual Flu / COVID boosters going forward.  Fasting lipid screening due at return  CBC and CMP reviewed from March and normal limits.   Low  back pain with possible herniated disc - Intermittent left-sided low back pain, sometimes with weakness but no radicular symptoms. History of herniated disc and nephrolithiasis complicates differential diagnosis. Improvement with exercises noted. Emphasized exercise to prevent worsening weakness. May benefit from seeing physical therapy again.  - Encourage continuation of exercises that alleviate symptoms. - Precautions to reach out to Dr. Nicolette Barrio for further evaluation discussed.   Family history of colon cancer Family history of colon cancer on both sides. Regular colonoscopy screenings every five years due to family history. Upcoming colonoscopy scheduled for Dec 16, 2023. - Proceed with scheduled colonoscopy on Dec 16, 2023.  Meds ordered this encounter  Medications   Pitavastatin  Calcium  4 MG TABS    Sig: Take 1 tablet (4 mg total) by mouth daily.    Dispense:  90 tablet    Refill:  3    Supervising Provider:   SNIDER, CYNTHIA [[id number]]   darunavir-cobicistat (PREZCOBIX ) 800-150 MG tablet    Sig: Take 1 tablet by mouth daily. Swallow whole. Do NOT crush, break or chew tablets. Take with food.    Dispense:  90 tablet    Refill:  3    Supervising Provider:   SNIDER, CYNTHIA [[id number]]   dolutegravir  (TIVICAY ) 50 MG tablet    Sig: Take 1 tablet (50 mg total) by mouth daily.    Dispense:  90 tablet    Refill:  3    Supervising Provider:   Liane Redman [phone]   Orders Placed This Encounter  Procedures   HIV 1 RNA quant-no reflex-bld   T-helper cells (CD4) count   Return in about 9 months (around 09/02/2024).  Gibson Kurtz, MSN, NP-C [facility] for Infectious Disease [facility] Pager: [phone]

## 2023-12-01 NOTE — Telephone Encounter (Signed)
 This is duplicative please check prior epic encounter for response

## 2023-12-01 NOTE — Telephone Encounter (Signed)
  Sean Conrad Mar 24, 1956 [id number]  12/01/23   Dear Dr. Nicolette Barrio:  We have scheduled the above named patient for a(n) colonoscopy procedure. Our records show that (s)he is on anticoagulation therapy.  Please advise as to whether the patient may come off their therapy of Eliquis  2 days prior to their procedure which is scheduled for 12/16/23.  Please route your response to Monita Anon, CMA or fax response to [phone].  Sincerely,   Monita Anon, [facility]

## 2023-12-01 NOTE — Patient Instructions (Signed)
 No changes to your medication  Start back the therapy exercises for your back - if the leg weakness persists I would say to give Dr. Nicolette Barrio a chance to check you out.   Will refill your tivicay , prezcobix  and pitavastatin 

## 2023-12-03 LAB — T-HELPER CELLS (CD4) COUNT (NOT AT ARMC)
Absolute CD4: 1345 {cells}/uL (ref 490–1740)
CD4 T Helper %: 30 % (ref 30–61)
Total lymphocyte count: 4492 {cells}/uL — ABNORMAL HIGH (ref 850–3900)

## 2023-12-03 LAB — HIV-1 RNA QUANT-NO REFLEX-BLD
HIV 1 RNA Quant: NOT DETECTED {copies}/mL
HIV-1 RNA Quant, Log: NOT DETECTED {Log_copies}/mL

## 2023-12-07 NOTE — Progress Notes (Signed)
 The ASCVD Risk score (Arnett DK, et al., 2019) failed to calculate for the following reasons:   The valid total cholesterol range is 130 to 320 mg/dL  Arlon Bergamo, BSN, RN

## 2023-12-11 ENCOUNTER — Telehealth: Payer: Self-pay | Admitting: Gastroenterology

## 2023-12-11 NOTE — Telephone Encounter (Signed)
 Informed patient to follow instructions provided at his office visit. Patient voiced understanding.

## 2023-12-11 NOTE — Telephone Encounter (Signed)
 Patient called and stated that he is scheduled for a colonoscopy for May the 21 st. Patient is not sure weather he is needing to take then Plenvu  or the sodium salfate. Patient has received two prep medication. Patient requesting a call back. Please advise.

## 2023-12-16 ENCOUNTER — Ambulatory Visit (AMBULATORY_SURGERY_CENTER): Admitting: Gastroenterology

## 2023-12-16 ENCOUNTER — Encounter: Payer: Self-pay | Admitting: Gastroenterology

## 2023-12-16 VITALS — BP 117/78 | HR 72 | Temp 98.3°F | Resp 12 | Ht 66.0 in | Wt 169.0 lb

## 2023-12-16 DIAGNOSIS — K644 Residual hemorrhoidal skin tags: Secondary | ICD-10-CM | POA: Diagnosis not present

## 2023-12-16 DIAGNOSIS — Z9189 Other specified personal risk factors, not elsewhere classified: Secondary | ICD-10-CM

## 2023-12-16 DIAGNOSIS — K6289 Other specified diseases of anus and rectum: Secondary | ICD-10-CM

## 2023-12-16 DIAGNOSIS — K640 First degree hemorrhoids: Secondary | ICD-10-CM

## 2023-12-16 DIAGNOSIS — Z800 Family history of malignant neoplasm of digestive organs: Secondary | ICD-10-CM

## 2023-12-16 DIAGNOSIS — Z1211 Encounter for screening for malignant neoplasm of colon: Secondary | ICD-10-CM

## 2023-12-16 MED ORDER — SODIUM CHLORIDE 0.9 % IV SOLN: 500.0000 mL | Freq: Once | INTRAVENOUS | Status: AC

## 2023-12-16 NOTE — Progress Notes (Signed)
 Vss nad trans to pacu

## 2023-12-16 NOTE — Patient Instructions (Signed)
 Discharge instructions given. Handout on Hemorrhoids and a High Fiber diet. Resume previous medications. Use FiberCon 1-2 tablets a day. Restart Eliquis  this evening. YOU HAD AN ENDOSCOPIC PROCEDURE TODAY AT THE [facility]:   Refer to the procedure report that was given to you for any specific questions about what was found during the examination.  If the procedure report does not answer your questions, please call your gastroenterologist to clarify.  If you requested that your care partner not be given the details of your procedure findings, then the procedure report has been included in a sealed envelope for you to review at your convenience later.  YOU SHOULD EXPECT: Some feelings of bloating in the abdomen. Passage of more gas than usual.  Walking can help get rid of the air that was put into your GI tract during the procedure and reduce the bloating. If you had a lower endoscopy (such as a colonoscopy or flexible sigmoidoscopy) you may notice spotting of blood in your stool or on the toilet paper. If you underwent a bowel prep for your procedure, you may not have a normal bowel movement for a few days.  Please Note:  You might notice some irritation and congestion in your nose or some drainage.  This is from the oxygen used during your procedure.  There is no need for concern and it should clear up in a day or so.  SYMPTOMS TO REPORT IMMEDIATELY:  Following lower endoscopy (colonoscopy or flexible sigmoidoscopy):  Excessive amounts of blood in the stool  Significant tenderness or worsening of abdominal pains  Swelling of the abdomen that is new, acute  Fever of 100F or higher   For urgent or emergent issues, a gastroenterologist can be reached at any hour by calling [phone]. Do not use MyChart messaging for urgent concerns.    DIET:  We do recommend a small meal at first, but then you may proceed to your regular diet.  Drink plenty of fluids but you should avoid  alcoholic beverages for 24 hours.  ACTIVITY:  You should plan to take it easy for the rest of today and you should NOT DRIVE or use heavy machinery until tomorrow (because of the sedation medicines used during the test).    FOLLOW UP: Our staff will call the number listed on your records the next business day following your procedure.  We will call around 7:15- 8:00 am to check on you and address any questions or concerns that you may have regarding the information given to you following your procedure. If we do not reach you, we will leave a message.     If any biopsies were taken you will be contacted by phone or by letter within the next 1-3 weeks.  Please call us  at [phone] if you have not heard about the biopsies in 3 weeks.    SIGNATURES/CONFIDENTIALITY: You and/or your care partner have signed paperwork which will be entered into your electronic medical record.  These signatures attest to the fact that that the information above on your After Visit Summary has been reviewed and is understood.  Full responsibility of the confidentiality of this discharge information lies with you and/or your care-partner.

## 2023-12-16 NOTE — Progress Notes (Signed)
 GASTROENTEROLOGY PROCEDURE H&P NOTE   Primary Care Physician: Adelia Homestead, MD  HPI: Sean Conrad is a 68 y.o. male who presents for Colonoscopy for FHx of Colon Cancer.  Past Medical History:  Diagnosis Date   Bell's palsy 2015   Colon polyps    DVT (deep venous thrombosis) (HCC) 1992   Right calf   Elevated serum creatinine    Erectile dysfunction    HIV infection (HCC)    Hyperlipidemia    Kidney stones    Pneumonia    Past Surgical History:  Procedure Laterality Date   COLONOSCOPY     INGUINAL HERNIA REPAIR Left    LITHOTRIPSY     Current Outpatient Medications  Medication Sig Dispense Refill   apixaban  (ELIQUIS ) 2.5 MG TABS tablet Take 1 tablet (2.5 mg total) by mouth 2 (two) times daily. 180 tablet 3   Cholecalciferol (VITAMIN D) 125 MCG (5000 UT) CAPS Take 1 tablet by mouth daily.     darunavir-cobicistat (PREZCOBIX ) 800-150 MG tablet Take 1 tablet by mouth daily. Swallow whole. Do NOT crush, break or chew tablets. Take with food. 90 tablet 3   dolutegravir  (TIVICAY ) 50 MG tablet Take 1 tablet (50 mg total) by mouth daily. 90 tablet 3   ELDERBERRY PO      Krill Oil 1000 MG CAPS Take by mouth.     Misc Natural Products (PROSTATE HEALTH) CAPS 1 capsule     Multiple Vitamins-Minerals (CENTRUM SILVER 50+MEN PO) Take by mouth.     Pitavastatin  Calcium  4 MG TABS Take 1 tablet (4 mg total) by mouth daily. 90 tablet 3   No current facility-administered medications for this visit.    Current Outpatient Medications:    apixaban  (ELIQUIS ) 2.5 MG TABS tablet, Take 1 tablet (2.5 mg total) by mouth 2 (two) times daily., Disp: 180 tablet, Rfl: 3   Cholecalciferol (VITAMIN D) 125 MCG (5000 UT) CAPS, Take 1 tablet by mouth daily., Disp: , Rfl:    darunavir-cobicistat (PREZCOBIX ) 800-150 MG tablet, Take 1 tablet by mouth daily. Swallow whole. Do NOT crush, break or chew tablets. Take with food., Disp: 90 tablet, Rfl: 3   dolutegravir  (TIVICAY ) 50 MG tablet, Take 1  tablet (50 mg total) by mouth daily., Disp: 90 tablet, Rfl: 3   ELDERBERRY PO, , Disp: , Rfl:    Krill Oil 1000 MG CAPS, Take by mouth., Disp: , Rfl:    Misc Natural Products (PROSTATE HEALTH) CAPS, 1 capsule, Disp: , Rfl:    Multiple Vitamins-Minerals (CENTRUM SILVER 50+MEN PO), Take by mouth., Disp: , Rfl:    Pitavastatin  Calcium  4 MG TABS, Take 1 tablet (4 mg total) by mouth daily., Disp: 90 tablet, Rfl: 3 Allergies  Allergen Reactions   Bactrim [Sulfamethoxazole-Trimethoprim] Rash   Family History  Problem Relation Age of Onset   Colon cancer Mother        dx in her early 37's   Cancer Mother    Colon cancer Father        dx in his 44's   Cancer Father    Hypertension Father    Colon cancer Paternal Grandmother    Esophageal cancer Neg Hx    Inflammatory bowel disease Neg Hx    Liver disease Neg Hx    Pancreatic cancer Neg Hx    Stomach cancer Neg Hx    Social History   Socioeconomic History   Marital status: Single    Spouse name: Not on file   Number of children:  0   Years of education: Not on file   Highest education level: Master's degree (e.g., MA, MS, MEng, MEd, MSW, MBA)  Occupational History   Occupation: [employer] part time  Tobacco Use   Smoking status: Every Day    Current packs/day: 1.00    Average packs/day: 1 pack/day for 4.0 years (4.0 ttl pk-yrs)    Types: Cigarettes    Passive exposure: Past   Smokeless tobacco: Never   Tobacco comments:    trying to quit, goes through a pack in about a week  Vaping Use   Vaping status: Never Used  Substance and Sexual Activity   Alcohol  use: Yes    Alcohol /week: 2.0 standard drinks of alcohol     Types: 1 Glasses of wine, 1 Shots of liquor per week    Comment: occasionally   Drug use: No   Sexual activity: Not Currently    Birth control/protection: Condom    Comment: Declined condoms  Other Topics Concern   Not on file  Social History Narrative   Not on file   Social Drivers of Health   Financial  Resource Strain: Low Risk  (10/16/2023)   Overall Financial Resource Strain (CARDIA)    Difficulty of Paying Living Expenses: Not very hard  Food Insecurity: No Food Insecurity (10/16/2023)   Hunger Vital Sign    Worried About Running Out of Food in the Last Year: Never true    Ran Out of Food in the Last Year: Never true  Transportation Needs: No Transportation Needs (10/16/2023)   PRAPARE - Administrator, Civil Service (Medical): No    Lack of Transportation (Non-Medical): No  Physical Activity: Unknown (10/16/2023)   Exercise Vital Sign    Days of Exercise per Week: 0 days    Minutes of Exercise per Session: Not on file  Stress: No Stress Concern Present (10/16/2023)   [employer] of Occupational Health - Occupational Stress Questionnaire    Feeling of Stress : Not at all  Social Connections: Moderately Isolated (10/16/2023)   Social Connection and Isolation Panel [NHANES]    Frequency of Communication with Friends and Family: More than three times a week    Frequency of Social Gatherings with Friends and Family: Once a week    Attends Religious Services: More than 4 times per year    Active Member of [employer] or Organizations: No    Attends Engineer, structural: Not on file    Marital Status: Never married  Intimate Partner Violence: Not on file    Physical Exam: There were no vitals filed for this visit. There is no height or weight on file to calculate BMI. GEN: NAD EYE: Sclerae anicteric ENT: MMM CV: Non-tachycardic GI: Soft, NT/ND NEURO:  Alert & Oriented x 3  Lab Results: No results for input(s): "WBC", "HGB", "HCT", "PLT" in the last 72 hours. BMET No results for input(s): "NA", "K", "CL", "CO2", "GLUCOSE", "BUN", "CREATININE", "CALCIUM " in the last 72 hours. LFT No results for input(s): "PROT", "ALBUMIN", "AST", "ALT", "ALKPHOS", "BILITOT", "BILIDIR", "IBILI" in the last 72 hours. PT/INR No results for input(s): "LABPROT", "INR" in the last  72 hours.   Impression / Plan: This is a 68 y.o.male who presents for Colonoscopy for FHx of Colon Cancer.   The risks and benefits of endoscopic evaluation/treatment were discussed with the patient and/or family; these include but are not limited to the risk of perforation, infection, bleeding, missed lesions, lack of diagnosis, severe illness requiring hospitalization, as  well as anesthesia and sedation related illnesses.  The patient's history has been reviewed, patient examined, no change in status, and deemed stable for procedure.  The patient and/or family is agreeable to proceed.    Yong Henle, MD [facility] Advanced Endoscopy Office # [phone]

## 2023-12-16 NOTE — Op Note (Signed)
 [facility] Patient Name: Sean Conrad Procedure Date: 12/16/2023 10:29 AM MRN: [id number] Endoscopist: Yong Henle , MD, [id number] Age: 68 Referring MD:  Date of Birth: 06/25/1956 Gender: Male Account #: [id number] Procedure:                Colonoscopy Indications:              Screening patient at increased risk: Family history                            of colorectal cancer in multiple 1st-degree                            relatives Medicines:                Monitored Anesthesia Care Procedure:                Pre-Anesthesia Assessment:                           - Prior to the procedure, a History and Physical                            was performed, and patient medications and                            allergies were reviewed. The patient's tolerance of                            previous anesthesia was also reviewed. The risks                            and benefits of the procedure and the sedation                            options and risks were discussed with the patient.                            All questions were answered, and informed consent                            was obtained. Prior Anticoagulants: The patient has                            taken no anticoagulant or antiplatelet agents. ASA                            Grade Assessment: II - A patient with mild systemic                            disease. After reviewing the risks and benefits,                            the patient was deemed in satisfactory condition to  undergo the procedure.                           After obtaining informed consent, the colonoscope                            was passed under direct vision. Throughout the                            procedure, the patient's blood pressure, pulse, and                            oxygen saturations were monitored continuously. The                            Olympus Scope [id number] was introduced  through the                            anus and advanced to the the cecum, identified by                            appendiceal orifice and ileocecal valve. The                            colonoscopy was performed without difficulty. The                            patient tolerated the procedure. The quality of the                            bowel preparation was adequate. The ileocecal                            valve, appendiceal orifice, and rectum were                            photographed. Scope In: 10:50:32 AM Scope Out: 11:03:04 AM Scope Withdrawal Time: 0 hours 9 minutes 23 seconds  Total Procedure Duration: 0 hours 12 minutes 32 seconds  Findings:                 Skin tags were found on perianal exam.                           The digital rectal exam findings include                            hemorrhoids. Pertinent negatives include no                            palpable rectal lesions.                           A large amount of semi-liquid stool was found in  the entire colon, interfering with visualization.                            Lavage of the area was performed using copious                            amounts, resulting in clearance with adequate                            visualization.                           Anal papilla was hypertrophied.                           Non-bleeding non-thrombosed external and internal                            hemorrhoids were found during retroflexion, during                            perianal exam and during digital exam. The                            hemorrhoids were Grade I (internal hemorrhoids that                            do not prolapse). Complications:            No immediate complications. Estimated Blood Loss:     Estimated blood loss: none. Impression:               - Perianal skin tags found on perianal exam.                            Hemorrhoids found on digital rectal exam.                            - Stool in the entire examined colon. Lavaged with                            adequate visualization.                           - Anal papilla was hypertrophied.                           - Non-bleeding non-thrombosed external and internal                            hemorrhoids. Recommendation:           - The patient will be observed post-procedure,                            until all discharge criteria are met.                           -  Discharge patient to home.                           - Patient has a contact number available for                            emergencies. The signs and symptoms of potential                            delayed complications were discussed with the                            patient. Return to normal activities tomorrow.                            Written discharge instructions were provided to the                            patient.                           - High fiber diet.                           - Use FiberCon 1-2 tablets PO daily.                           - May restart Eliquis  this evening.                           - Continue present medications.                           - Repeat colonoscopy in 5 years for high risk                            screening purposes. Consider 1 week of daily                            MiraLAX prior to procedure preparation.                           - The findings and recommendations were discussed                            with the patient.                           - The findings and recommendations were discussed                            with the designated responsible adult. Yong Henle, MD 12/16/2023 11:08:42 AM

## 2023-12-17 ENCOUNTER — Telehealth: Payer: Self-pay

## 2023-12-17 NOTE — Telephone Encounter (Signed)
  Follow up Call-     12/16/2023   10:20 AM  Call back number  Post procedure Call Back phone  # [phone]  Permission to leave phone message Yes     Patient questions:  Do you have a fever, pain , or abdominal swelling? No. Pain Score  0 *  Have you tolerated food without any problems? Yes.    Have you been able to return to your normal activities? Yes.    Do you have any questions about your discharge instructions: Diet   No. Medications  No. Follow up visit  No.  Do you have questions or concerns about your Care? No.  Actions: * If pain score is 4 or above: No action needed, pain <4.

## 2024-02-02 DIAGNOSIS — Z202 Contact with and (suspected) exposure to infections with a predominantly sexual mode of transmission: Secondary | ICD-10-CM

## 2024-02-02 MED ORDER — DOXYCYCLINE HYCLATE 100 MG PO TABS: ORAL_TABLET | ORAL | 1 refills | Status: AC
